# Patient Record
Sex: Female | Born: 1995 | Race: White | Hispanic: No | Marital: Single | Smoking: Never smoker
Health system: Southern US, Community
[De-identification: ages and names within clinical notes are randomized; demographics above are authoritative.]

## PROBLEM LIST (undated history)

## (undated) ENCOUNTER — Inpatient Hospital Stay (HOSPITAL_COMMUNITY): Payer: Self-pay

## (undated) ENCOUNTER — Emergency Department (HOSPITAL_COMMUNITY): Admission: EM | Payer: Medicaid Other

## (undated) ENCOUNTER — Inpatient Hospital Stay (HOSPITAL_COMMUNITY): Payer: Medicaid Other

## (undated) DIAGNOSIS — R519 Headache, unspecified: Secondary | ICD-10-CM

## (undated) DIAGNOSIS — F419 Anxiety disorder, unspecified: Secondary | ICD-10-CM

## (undated) DIAGNOSIS — N390 Urinary tract infection, site not specified: Secondary | ICD-10-CM

## (undated) DIAGNOSIS — D649 Anemia, unspecified: Secondary | ICD-10-CM

## (undated) DIAGNOSIS — G43909 Migraine, unspecified, not intractable, without status migrainosus: Secondary | ICD-10-CM

## (undated) DIAGNOSIS — R55 Syncope and collapse: Secondary | ICD-10-CM

## (undated) HISTORY — PX: NO PAST SURGERIES: SHX2092

## (undated) HISTORY — DX: Migraine, unspecified, not intractable, without status migrainosus: G43.909

## (undated) HISTORY — PX: OTHER SURGICAL HISTORY: SHX169

---
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
## 2017-02-08 IMAGING — US US ART/VEN ABD/PELV/SCROTUM DOPPLER LTD
1 series · 13 of 25 positions shown · non-contrast
Comparison: None.

CLINICAL DATA: Bilateral pelvic pain, right greater than left for
approximately 10 hours.



[Series 1: us art/ven abd/pelv/scrotum doppler ltd · 0.24mm/px · 13 of 46 slices shown]
[im 1/46]
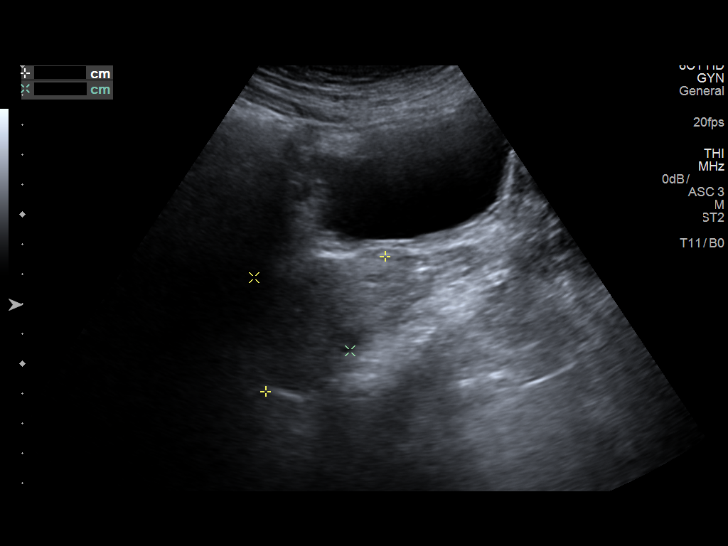
[im 4/46]
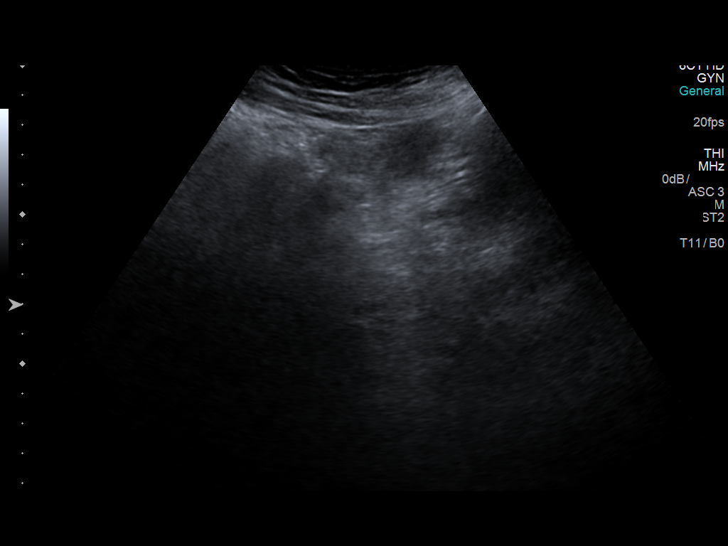
[im 8/46]
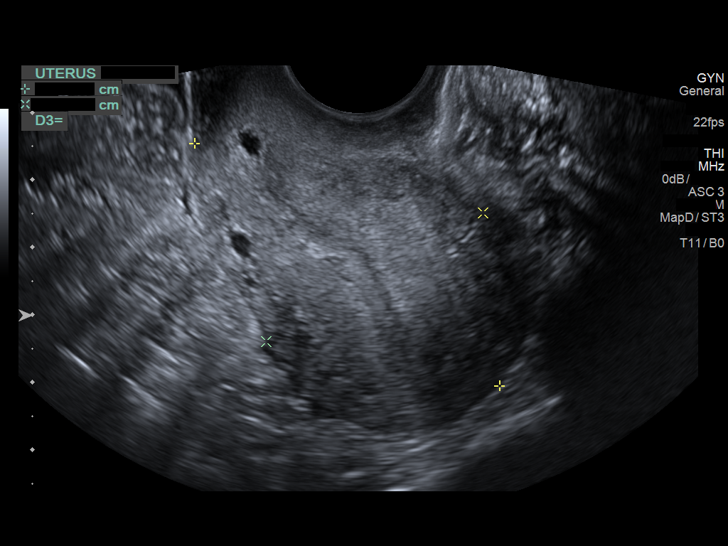
[im 12/46]
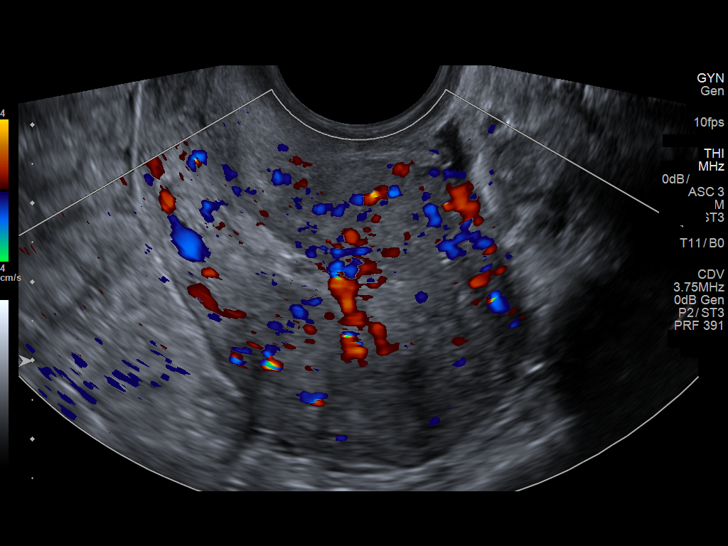
[im 16/46]
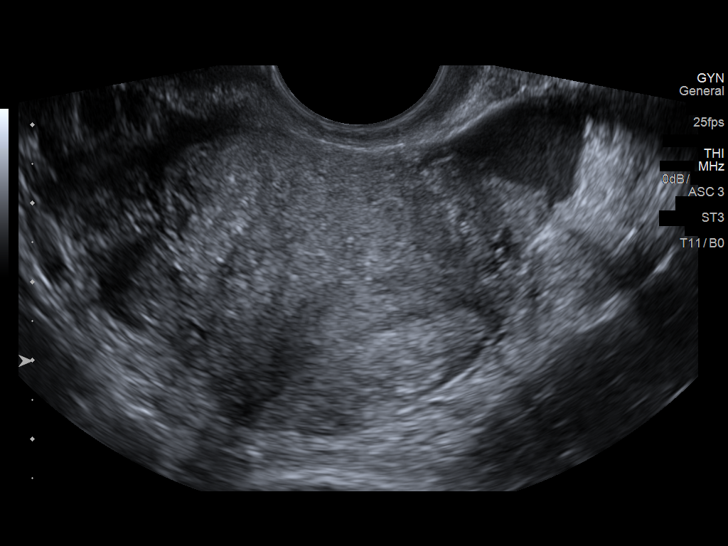
[im 19/46]
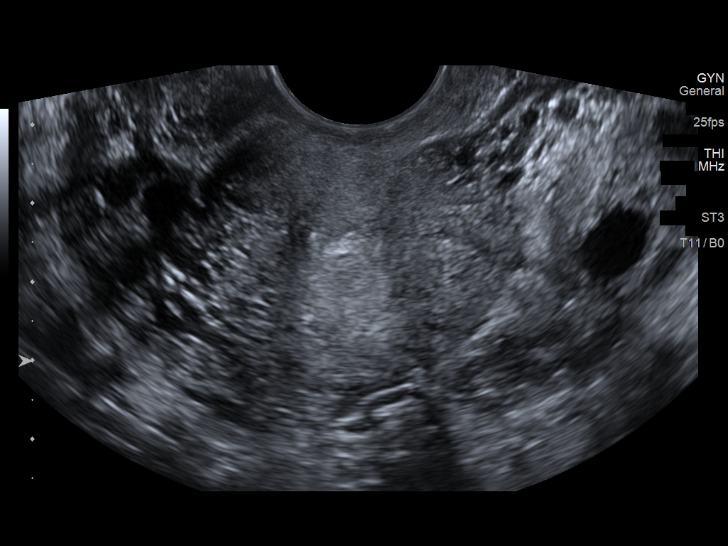
[im 23/46]
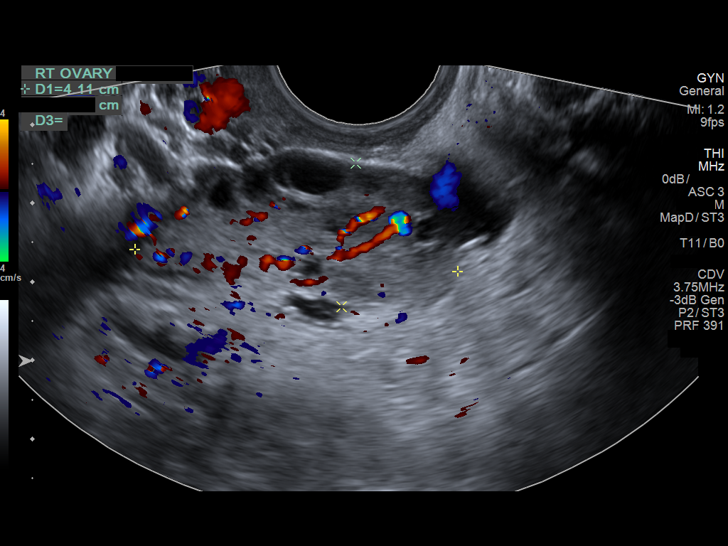
[im 27/46]
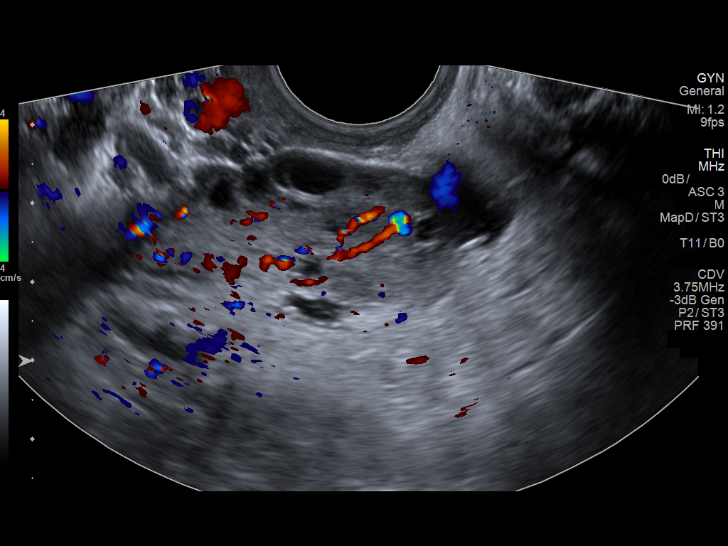
[im 31/46]
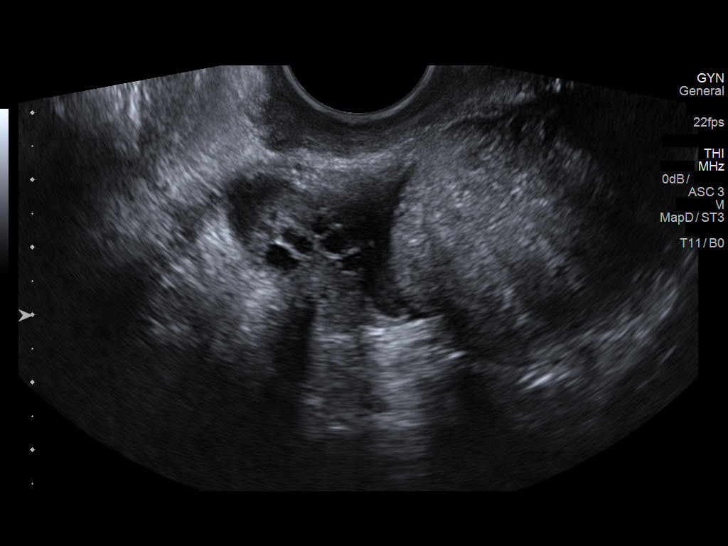
[im 34/46]
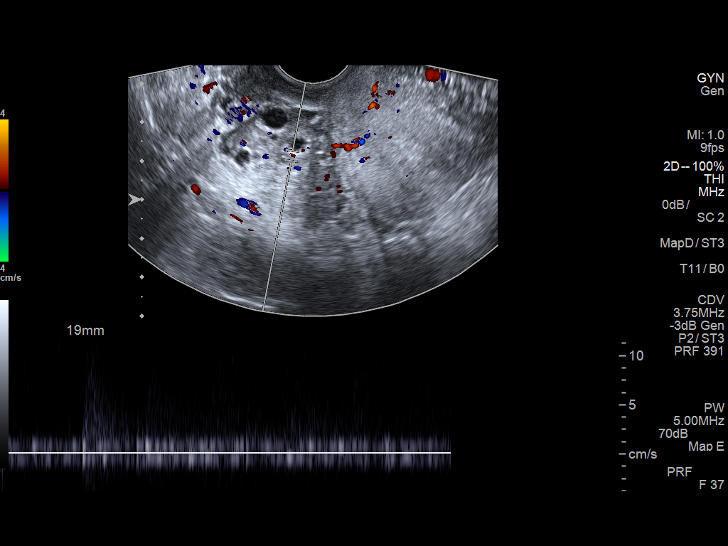
[im 38/46]
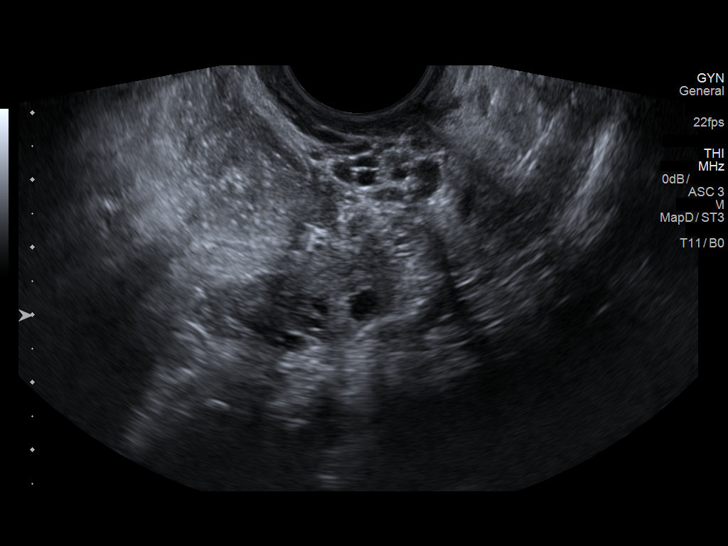
[im 42/46]
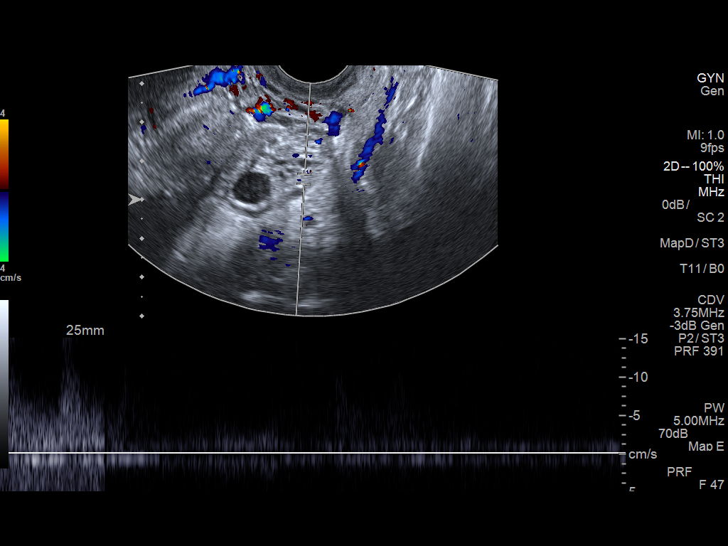
[im 46/46]
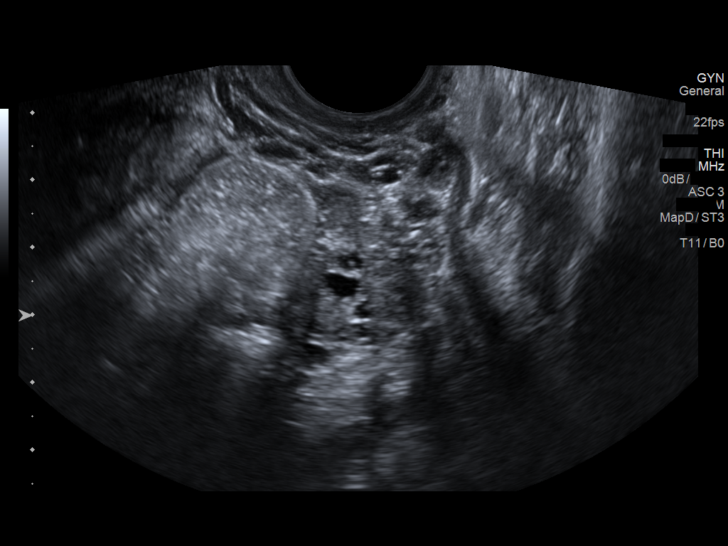

[13 of 25 positions shown; findings below may reference images not displayed]

FINDINGS: Uterus

Measurements: 5.8 x 3.7 x 4.0 cm. Retroverted position. No
myometrial abnormalities.

Endometrium

Thickness: 3.0 mm.  No focal abnormality visualized.

Right ovary

Measurements: 4.1 x 8 1.8 x 2.9 cm. Normal appearance/no adnexal
mass.

Left ovary

Measurements: 3.4 x 1.6 x 2.4 cm. Normal appearance/no adnexal mass.

Pulsed Doppler evaluation of both ovaries demonstrates normal
low-resistance arterial and venous waveforms.

Other findings

Small amount of free fluid adjacent to the right ovary.
IMPRESSION: 1. Normal sonographic appearance of both ovaries and uterus.
2. Small amount of right periadnexal fluid.
3. Patent intra-ovarian blood flow bilaterally.

## 2017-06-12 ENCOUNTER — Emergency Department
Admission: EM | Admit: 2017-06-12 | Discharge: 2017-06-12 | Disposition: A | Discharge status: Home / Self Care | Payer: 59 | Source: Home / Self Care | Attending: Family Medicine | Admitting: Family Medicine

## 2017-06-12 ENCOUNTER — Other Ambulatory Visit: Payer: Self-pay

## 2017-06-12 DIAGNOSIS — R319 Hematuria, unspecified: Secondary | ICD-10-CM | POA: Diagnosis not present

## 2017-06-12 DIAGNOSIS — N390 Urinary tract infection, site not specified: Secondary | ICD-10-CM

## 2017-06-12 HISTORY — DX: Anemia, unspecified: D64.9

## 2017-06-12 LAB — POCT CBC W AUTO DIFF (K'VILLE URGENT CARE)

## 2017-06-12 LAB — POCT URINE PREGNANCY: Preg Test, Ur: NEGATIVE

## 2017-06-12 LAB — POCT URINALYSIS DIP (MANUAL ENTRY)
Bilirubin, UA: NEGATIVE
Glucose, UA: NEGATIVE mg/dL
Ketones, POC UA: NEGATIVE mg/dL
NITRITE UA: NEGATIVE
Protein Ur, POC: 100 mg/dL — AB
UROBILINOGEN UA: 0.2 U/dL
pH, UA: 6.5 (ref 5.0–8.0)

## 2017-06-12 MED ORDER — PHENAZOPYRIDINE HCL 200 MG PO TABS: 200.0000 mg | ORAL_TABLET | Freq: Three times a day (TID) | ORAL | 0 refills | Status: DC

## 2017-06-12 MED ORDER — CIPROFLOXACIN HCL 500 MG PO TABS: 500.0000 mg | ORAL_TABLET | Freq: Two times a day (BID) | ORAL | 0 refills | Status: DC

## 2017-06-12 NOTE — ED Provider Notes (Signed)
[facility]    CSN: [id number] Arrival date & time: 06/12/17  1954     History   Chief Complaint Chief Complaint  Patient presents with  . Back Pain  . Urinary Frequency    HPI Debra Stafford is a 22 y.o. female.   Patient complains of 5 day history of dysuria, frequency, nocturia, and hematuria.  Last night she developed left back ache, and today had right back ache also.  She vomited last night.  She believes that she had fever today.  Three days ago she visited [address] in [place], where STD and urine tests are pending, but she has not been advised of results.  She denies pelvic or abdominal pain, and no vaginal discharge.  Patient's last menstrual period was 05/13/2017.  She states that she had a UTI last month. She has a family history of kidney stones (father).   The history is provided by the patient.  Dysuria  Pain quality:  Burning Pain severity:  Moderate Onset quality:  Gradual Duration:  5 days Timing:  Constant Progression:  Unchanged Chronicity:  Recurrent Recent urinary tract infections: yes   Relieved by:  Nothing Worsened by:  Nothing Ineffective treatments:  None tried Urinary symptoms: frequent urination, hematuria and hesitancy   Urinary symptoms: no discolored urine, no foul-smelling urine and no bladder incontinence   Associated symptoms: fever, flank pain, nausea and vomiting   Associated symptoms: no abdominal pain, no genital lesions and no vaginal discharge   Risk factors: sexually active     Past Medical History:  Diagnosis Date  . Anemia     There are no active problems to display for this patient.   Past Surgical History:  Procedure Laterality Date  . arm surgery      OB History    No data available       Home Medications    Prior to Admission medications   Medication Sig Start Date End Date Taking? Authorizing Provider  ciprofloxacin (CIPRO) 500 MG tablet Take 1 tablet (500 mg total) by mouth 2 (two)  times daily. 06/12/17   Lattie Haw, MD  Norgestimate-Ethinyl Estradiol Triphasic 0.18/0.215/0.25 MG-25 MCG tab Take 1 tablet by mouth daily. 03/14/16   Anders Simmonds, PA-C  phenazopyridine (PYRIDIUM) 200 MG tablet Take 1 tablet (200 mg total) by mouth 3 (three) times daily. Take after meals. 06/12/17   Lattie Haw, MD    Family History History reviewed. No pertinent family history.  Social History Social History   Tobacco Use  . Smoking status: Never Smoker  . Smokeless tobacco: Never Used  Substance Use Topics  . Alcohol use: No  . Drug use: No     Allergies   Codeine   Review of Systems Review of Systems  Constitutional: Positive for fever.  Gastrointestinal: Positive for nausea and vomiting. Negative for abdominal pain.  Genitourinary: Positive for dysuria and flank pain. Negative for vaginal discharge.  All other systems reviewed and are negative.    Physical Exam Triage Vital Signs ED Triage Vitals  Enc Vitals Group     BP 06/12/17 2024 107/73     Pulse Rate 06/12/17 2024 99     Resp --      Temp 06/12/17 2024 98 F (36.7 C)     Temp Source 06/12/17 2024 Oral     SpO2 06/12/17 2024 100 %     Weight 06/12/17 2025 128 lb (58.1 kg)     Height 06/12/17 2025  5\' 4"  (1.626 m)     Head Circumference --      Peak Flow --      Pain Score 06/12/17 2024 7     Pain Loc --      Pain Edu? --      Excl. in GC? --    No data found.  Updated Vital Signs BP 107/73 (BP Location: Left Arm)   Pulse 99   Temp 98 F (36.7 C) (Oral)   Ht 5\' 4"  (1.626 m)   Wt 128 lb (58.1 kg)   LMP 05/13/2017   SpO2 100%   BMI 21.97 kg/m   Visual Acuity Right Eye Distance:   Left Eye Distance:   Bilateral Distance:    Right Eye Near:   Left Eye Near:    Bilateral Near:     Physical Exam Nursing notes and Vital Signs reviewed. Appearance:  Patient appears stated age, and in no acute distress.    Eyes:  Pupils are equal, round, and reactive to light and  accomodation.  Extraocular movement is intact.  Conjunctivae are not inflamed   Pharynx:  Normal; moist mucous membranes  Neck:  Supple.  No adenopathy Lungs:  Clear to auscultation.  Breath sounds are equal.  Moving air well. Heart:  Regular rate and rhythm without murmurs, rubs, or gallops.  Abdomen:  Nontender without masses or hepatosplenomegaly.  Bowel sounds are present.   Bilateral mild flank tenderness present. Extremities:  No edema.  Skin:  No rash present.     UC Treatments / Results  Labs (all labs ordered are listed, but only abnormal results are displayed) Labs Reviewed  POCT URINALYSIS DIP (MANUAL ENTRY) - Abnormal; Notable for the following components:      Result Value   Clarity, UA cloudy (*)    Spec Grav, UA >=1.030 (*)    Blood, UA trace-intact (*)    Protein Ur, POC =100 (*)    Leukocytes, UA Moderate (2+) (*)    All other components within normal limits  URINE CULTURE  POCT CBC W AUTO DIFF ([facility]):  WBC 10.2; LY 26.9; MO 7.4; GR 65.7; Hgb 12.2; Platelets 220   POCT URINE PREGNANCY Negative    EKG  EKG Interpretation None       Radiology No results found.  Procedures Procedures (including critical care time)  Medications Ordered in UC Medications - No data to display   Initial Impression / Assessment and Plan / UC Course  I have reviewed the triage vital signs and the nursing notes.  Pertinent labs & imaging results that were available during my care of the patient were reviewed by me and considered in my medical decision making (see chart for details).    Urine culture pending. Begin Cipro 500mg  BID for one week.  Rx for Pyridium. Increase fluid intake. If symptoms become significantly worse during the night or over the weekend, proceed to the local emergency room.  Followup with Family Doctor if not improved in one week.     Final Clinical Impressions(s) / UC Diagnoses   Final diagnoses:  Urinary tract infection with  hematuria, site unspecified    ED Discharge Orders        Ordered    ciprofloxacin (CIPRO) 500 MG tablet  2 times daily     06/12/17 2115    phenazopyridine (PYRIDIUM) 200 MG tablet  3 times daily     06/12/17 2115          Soma Bachand,  Tera MaterStephen A, MD 06/14/17 2138

## 2017-06-12 NOTE — Discharge Instructions (Signed)
Increase fluid intake. °If symptoms become significantly worse during the night or over the weekend, proceed to the local emergency room.  °

## 2017-06-12 NOTE — ED Triage Notes (Signed)
Pt started having SX on Sunday night.  Went to [facility] Monday, had blood drawn, urine collected.  Never heard results per patient.  Has progressively become worse this week.  Yesterday, had left sided back pain, and started vomiting during the night.  Today both sides of lower back are hurting.

## 2017-06-12 NOTE — ED Notes (Signed)
Blood draw left AC, tolerated well, 1 stick.

## 2017-06-14 ENCOUNTER — Encounter (HOSPITAL_BASED_OUTPATIENT_CLINIC_OR_DEPARTMENT_OTHER): Payer: Self-pay | Admitting: Emergency Medicine

## 2017-06-14 ENCOUNTER — Emergency Department (HOSPITAL_BASED_OUTPATIENT_CLINIC_OR_DEPARTMENT_OTHER)
Admission: EM | Admit: 2017-06-14 | Discharge: 2017-06-14 | Disposition: A | Discharge status: Home / Self Care | Payer: 59 | Attending: Emergency Medicine | Admitting: Emergency Medicine

## 2017-06-14 ENCOUNTER — Other Ambulatory Visit: Payer: Self-pay

## 2017-06-14 DIAGNOSIS — M549 Dorsalgia, unspecified: Secondary | ICD-10-CM | POA: Diagnosis present

## 2017-06-14 DIAGNOSIS — R1110 Vomiting, unspecified: Secondary | ICD-10-CM | POA: Insufficient documentation

## 2017-06-14 DIAGNOSIS — Z79899 Other long term (current) drug therapy: Secondary | ICD-10-CM | POA: Diagnosis not present

## 2017-06-14 DIAGNOSIS — N12 Tubulo-interstitial nephritis, not specified as acute or chronic: Secondary | ICD-10-CM

## 2017-06-14 LAB — CBC WITH DIFFERENTIAL/PLATELET
BASOS ABS: 0 10*3/uL (ref 0.0–0.1)
Basophils Relative: 0 %
EOS PCT: 3 %
Eosinophils Absolute: 0.3 10*3/uL (ref 0.0–0.7)
HCT: 36 % (ref 36.0–46.0)
Hemoglobin: 12.2 g/dL (ref 12.0–15.0)
LYMPHS PCT: 20 %
Lymphs Abs: 1.7 10*3/uL (ref 0.7–4.0)
MCH: 30 pg (ref 26.0–34.0)
MCHC: 33.9 g/dL (ref 30.0–36.0)
MCV: 88.5 fL (ref 78.0–100.0)
Monocytes Absolute: 0.8 10*3/uL (ref 0.1–1.0)
Monocytes Relative: 9 %
Neutro Abs: 5.8 10*3/uL (ref 1.7–7.7)
Neutrophils Relative %: 68 %
PLATELETS: 226 10*3/uL (ref 150–400)
RBC: 4.07 MIL/uL (ref 3.87–5.11)
RDW: 12.1 % (ref 11.5–15.5)
WBC: 8.6 10*3/uL (ref 4.0–10.5)

## 2017-06-14 LAB — URINALYSIS, ROUTINE W REFLEX MICROSCOPIC
Glucose, UA: 100 mg/dL — AB
Hgb urine dipstick: NEGATIVE
Ketones, ur: 15 mg/dL — AB
NITRITE: POSITIVE — AB
Protein, ur: 100 mg/dL — AB
SPECIFIC GRAVITY, URINE: 1.025 (ref 1.005–1.030)
pH: 5 (ref 5.0–8.0)

## 2017-06-14 LAB — BASIC METABOLIC PANEL
Anion gap: 9 (ref 5–15)
BUN: 14 mg/dL (ref 6–20)
CO2: 24 mmol/L (ref 22–32)
CREATININE: 0.76 mg/dL (ref 0.44–1.00)
Calcium: 9.1 mg/dL (ref 8.9–10.3)
Chloride: 106 mmol/L (ref 101–111)
GFR calc Af Amer: >60 mL/min (ref >60)
Glucose, Bld: 93 mg/dL (ref 65–99)
POTASSIUM: 3.7 mmol/L (ref 3.5–5.1)
SODIUM: 139 mmol/L (ref 135–145)

## 2017-06-14 LAB — URINALYSIS, MICROSCOPIC (REFLEX): RBC / HPF: NONE SEEN RBC/hpf (ref 0–5)

## 2017-06-14 LAB — PREGNANCY, URINE: PREG TEST UR: NEGATIVE

## 2017-06-14 MED ORDER — CEPHALEXIN 500 MG PO CAPS: 500.0000 mg | ORAL_CAPSULE | Freq: Four times a day (QID) | ORAL | 0 refills | Status: DC

## 2017-06-14 MED ORDER — SODIUM CHLORIDE 0.9 % IV BOLUS (SEPSIS)
1000.0000 mL | Freq: Once | INTRAVENOUS | Status: AC
  Administered 2017-06-14: 1000 mL via INTRAVENOUS

## 2017-06-14 MED ORDER — DEXTROSE 5 % IV SOLN
1.0000 g | Freq: Once | INTRAVENOUS | Status: AC
  Administered 2017-06-14: 1 g via INTRAVENOUS
  Filled 2017-06-14: qty 10

## 2017-06-14 MED ORDER — ONDANSETRON HCL 4 MG/2ML IJ SOLN
4.0000 mg | Freq: Once | INTRAMUSCULAR | Status: AC
  Administered 2017-06-14: 4 mg via INTRAVENOUS
  Filled 2017-06-14: qty 2

## 2017-06-14 MED ORDER — MORPHINE SULFATE (PF) 4 MG/ML IV SOLN
4.0000 mg | Freq: Once | INTRAVENOUS | Status: AC
  Administered 2017-06-14: 4 mg via INTRAVENOUS
  Filled 2017-06-14: qty 1

## 2017-06-14 MED ORDER — KETOROLAC TROMETHAMINE 30 MG/ML IJ SOLN
30.0000 mg | Freq: Once | INTRAMUSCULAR | Status: AC
  Administered 2017-06-14: 30 mg via INTRAVENOUS
  Filled 2017-06-14: qty 1

## 2017-06-14 MED ORDER — ONDANSETRON HCL 4 MG PO TABS: 4.0000 mg | ORAL_TABLET | Freq: Three times a day (TID) | ORAL | 0 refills | Status: DC | PRN

## 2017-06-14 MED ORDER — AMOXICILLIN-POT CLAVULANATE 875-125 MG PO TABS: 1.0000 | ORAL_TABLET | Freq: Two times a day (BID) | ORAL | 0 refills | Status: DC

## 2017-06-14 NOTE — Discharge Instructions (Signed)
Your evaluated in the emergency department for back pain nausea and vomiting in the setting of a urinary tract infection.  You probably have a kidney infection.  You were given fluids and pain medicine and a dose of IV antibiotics.  We are switching your pill antibiotics in case you may be resistant to the one you are on.  Please continue to keep hydrated, take Tylenol or ibuprofen for pain, and finish your antibiotics that we are prescribing you.  We are also prescribing you some medicine for nausea.  You should follow-up with your doctor early this week.  Please return if you are getting any worse.

## 2017-06-14 NOTE — ED Triage Notes (Signed)
Pt presents with c/o back pain and vomiting. Pt went to urgent care Tuesday and Thursday for UTI symptoms but is worse today.

## 2017-06-14 NOTE — ED Provider Notes (Signed)
[facility] EMERGENCY DEPARTMENT Provider Note   CSN: [id number] Arrival date & time: 06/14/17  7829     History   Chief Complaint Chief Complaint  Patient presents with  . Back Pain  . Emesis    HPI Debra Stafford is a 22 y.o. female.  The history is provided by the patient.  Back Pain   This is a new problem. The current episode started more than 2 days ago. The problem occurs constantly. The problem has been gradually worsening. The pain is associated with no known injury. The pain is present in the lumbar spine. The quality of the pain is described as stabbing. The pain is severe. The symptoms are aggravated by bending, twisting and certain positions. Associated symptoms include abdominal pain and dysuria. Pertinent negatives include no chest pain, no fever, no numbness, no abdominal swelling, no bowel incontinence, no tingling and no weakness.  Emesis   Associated symptoms include abdominal pain. Pertinent negatives include no arthralgias, no chills, no cough and no fever.    22 year old female with no significant past medical history presents with urinary symptoms since Sunday.  She states she had urinary frequency dysuria and urinary hesitancy starting on Sunday.  She went to her PCP on Monday who told her that everything looked okay.  She started experiencing low back pain and continued urinary symptoms on Tuesday and Wednesday and on Thursday she went to [facility] urgent care where she was diagnosed with a UTI.  She was treated with Cipro and Pyridium.  Last night she went out for dinner and the low back pain continued to worsen.  She denies fevers but did have some sweats.  The back pain is lower may be greater on the right and is severe in nature.  It is worsened with movement.  She continues to have nausea and vomiting.  She continues with her urinary symptoms.  She denies vaginal discharge or bleeding.  The last menstrual period was about Christmas time.   Past  Medical History:  Diagnosis Date  . Anemia     There are no active problems to display for this patient.   Past Surgical History:  Procedure Laterality Date  . arm surgery      OB History    No data available       Home Medications    Prior to Admission medications   Medication Sig Start Date End Date Taking? Authorizing Provider  ciprofloxacin (CIPRO) 500 MG tablet Take 1 tablet (500 mg total) by mouth 2 (two) times daily. 06/12/17   Lattie Haw, MD  Norgestimate-Ethinyl Estradiol Triphasic 0.18/0.215/0.25 MG-25 MCG tab Take 1 tablet by mouth daily. 03/14/16   Anders Simmonds, PA-C  phenazopyridine (PYRIDIUM) 200 MG tablet Take 1 tablet (200 mg total) by mouth 3 (three) times daily. Take after meals. 06/12/17   Lattie Haw, MD    Family History No family history on file.  Social History Social History   Tobacco Use  . Smoking status: Never Smoker  . Smokeless tobacco: Never Used  Substance Use Topics  . Alcohol use: No  . Drug use: No     Allergies   Codeine   Review of Systems Review of Systems  Constitutional: Negative for chills and fever.  HENT: Negative for ear pain and sore throat.   Eyes: Negative for pain and visual disturbance.  Respiratory: Negative for cough and shortness of breath.   Cardiovascular: Negative for chest pain and palpitations.  Gastrointestinal: Positive for  abdominal pain and vomiting. Negative for bowel incontinence.  Genitourinary: Positive for dysuria. Negative for hematuria.  Musculoskeletal: Positive for back pain. Negative for arthralgias.  Skin: Negative for color change and rash.  Neurological: Negative for tingling, seizures, syncope, weakness and numbness.  All other systems reviewed and are negative.    Physical Exam Updated Vital Signs BP 122/70 (BP Location: Left Arm)   Pulse 86   Temp 97.7 F (36.5 C) (Oral)   Resp 20   Ht 5\' 4"  (1.626 m)   Wt 59 kg (130 lb)   LMP 05/13/2017   SpO2 100%   BMI  22.31 kg/m   Physical Exam  Constitutional: She appears well-developed and well-nourished. No distress.  HENT:  Head: Normocephalic and atraumatic.  Eyes: Conjunctivae are normal.  Neck: Neck supple.  Cardiovascular: Normal rate and regular rhythm.  No murmur heard. Pulmonary/Chest: Effort normal and breath sounds normal. No respiratory distress.  Abdominal: Soft. There is no tenderness. There is no tenderness at McBurney's point and negative Murphy's sign.  Musculoskeletal: Normal range of motion. She exhibits tenderness (diffuse low back). She exhibits no edema.  No CVA tenderness  Neurological: She is alert. GCS eye subscore is 4. GCS verbal subscore is 5. GCS motor subscore is 6.  Skin: Skin is warm and dry.  Psychiatric: She has a normal mood and affect.  Nursing note and vitals reviewed.    ED Treatments / Results  Labs (all labs ordered are listed, but only abnormal results are displayed) Labs Reviewed  URINALYSIS, ROUTINE W REFLEX MICROSCOPIC - Abnormal; Notable for the following components:      Result Value   Color, Urine ORANGE (*)    APPearance CLOUDY (*)    Glucose, UA 100 (*)    Bilirubin Urine MODERATE (*)    Ketones, ur 15 (*)    Protein, ur 100 (*)    Nitrite POSITIVE (*)    Leukocytes, UA TRACE (*)    All other components within normal limits  URINALYSIS, MICROSCOPIC (REFLEX) - Abnormal; Notable for the following components:   Bacteria, UA MANY (*)    Squamous Epithelial / LPF 6-30 (*)    All other components within normal limits  URINE CULTURE  PREGNANCY, URINE  BASIC METABOLIC PANEL  CBC WITH DIFFERENTIAL/PLATELET    EKG  EKG Interpretation None       Radiology No results found.  Procedures Procedures (including critical care time)  Medications Ordered in ED Medications  ondansetron (ZOFRAN) injection 4 mg (not administered)  sodium chloride 0.9 % bolus 1,000 mL (not administered)  morphine 4 MG/ML injection 4 mg (not administered)       Initial Impression / Assessment and Plan / ED Course  I have reviewed the triage vital signs and the nursing notes.  Pertinent labs & imaging results that were available during my care of the patient were reviewed by me and considered in my medical decision making (see chart for details).  Clinical Course as of Jun 14 913  Sat Jun 14, 2017  [id number] Reviewed prior workup and note from outside facility.  The urinalysis was positive for leuks and the urine culture is pending. Preg test negative. No blood work done.  She has been on Cipro for 2 days.  Also on Pyridium.  Allergic to Bactrim.  [MB]  0750 Differential diagnosis is pyelonephritis, musculoskeletal low back pain, renal colic, PID.  Patient states she saw her primary on Monday and with a clean urine they actually did STD  testing.  I do not see evidence of these labs or the note in our system.  [MB]  0831 Initial blood work looks fairly benign.  On reevaluation patient's feeling better.  The urinalysis still looks as possible infection nitrite positive.  Will give her a dose of ceftriaxone and likely need to send her out on some Keflex.  It is possible she is been resistant to fluoroquinolones why she progressed in her symptoms.  [MB]  0858 Back pain resolved after Toradol.  Patient and father comfortable with her going home on different antibiotic and to follow-up with her primary care doctor  [MB]    Clinical Course User Index [MB] Terrilee Files, MD      Final Clinical Impressions(s) / ED Diagnoses   Final diagnoses:  Pyelonephritis    ED Discharge Orders        Ordered    cephALEXin (KEFLEX) 500 MG capsule  4 times daily,   Status:  Discontinued     06/14/17 0833    ondansetron (ZOFRAN) 4 MG tablet  Every 8 hours PRN     06/14/17 0833    amoxicillin-clavulanate (AUGMENTIN) 875-125 MG tablet  Every 12 hours     06/14/17 0903       Terrilee Files, MD 06/14/17 [phone]

## 2017-06-15 ENCOUNTER — Telehealth: Payer: Self-pay | Admitting: Emergency Medicine

## 2017-06-15 LAB — URINE CULTURE
Culture: NO GROWTH
MICRO NUMBER: 90103169
SPECIMEN QUALITY: ADEQUATE

## 2017-06-15 NOTE — Telephone Encounter (Signed)
Patient was seen in the ED yesterday and treated with pylonepheritis.  ED treated patient and given another round of antibiotics.  Will follow up with PCP.

## 2018-03-13 ENCOUNTER — Inpatient Hospital Stay (HOSPITAL_COMMUNITY)
Admission: AD | Admit: 2018-03-13 | Discharge: 2018-03-13 | Disposition: A | Discharge status: Home / Self Care | Payer: Self-pay | Source: Ambulatory Visit | Attending: Obstetrics and Gynecology | Admitting: Obstetrics and Gynecology

## 2018-03-13 ENCOUNTER — Encounter (HOSPITAL_COMMUNITY): Payer: Self-pay | Admitting: *Deleted

## 2018-03-13 ENCOUNTER — Emergency Department
Admission: EM | Admit: 2018-03-13 | Discharge: 2018-03-13 | Discharge status: Absent without leave | Payer: Self-pay | Source: Home / Self Care

## 2018-03-13 ENCOUNTER — Other Ambulatory Visit: Payer: Self-pay

## 2018-03-13 DIAGNOSIS — N898 Other specified noninflammatory disorders of vagina: Secondary | ICD-10-CM

## 2018-03-13 LAB — URINALYSIS, ROUTINE W REFLEX MICROSCOPIC
Bilirubin Urine: NEGATIVE
Glucose, UA: NEGATIVE mg/dL
KETONES UR: NEGATIVE mg/dL
Nitrite: NEGATIVE
PROTEIN: NEGATIVE mg/dL
Specific Gravity, Urine: 1.019 (ref 1.005–1.030)
pH: 5 (ref 5.0–8.0)

## 2018-03-13 LAB — WET PREP, GENITAL
Clue Cells Wet Prep HPF POC: NONE SEEN
SPERM: NONE SEEN
Trich, Wet Prep: NONE SEEN
YEAST WET PREP: NONE SEEN

## 2018-03-13 LAB — POCT PREGNANCY, URINE: PREG TEST UR: NEGATIVE

## 2018-03-13 MED ORDER — TERCONAZOLE 0.4 % VA CREA: 1.0000 | TOPICAL_CREAM | Freq: Every day | VAGINAL | 0 refills | Status: AC

## 2018-03-13 NOTE — MAU Note (Addendum)
Fri morning, went to [facility] ED, had been having abd and back pain, then started bleeding profusely. - on exam they found a stuck tampon and removed.  Had STD  Test and [place]; every thing was fine.  Was given 2 different antibiotics, every time she takes them - she throws up 30 min.  Went to Primary Dr on Tue.  The antibiotics were for GC/Chlamydia, he told her to stop the antibiotics and gave her a one time Diflucan for yeast.  Had a little bleeding yesterday.  Hurts to pee, swollen and itchy now, continues to bleed.  Now the pain in her back has returned.  Started having loose stools last Friday, that also  Hurts and burns, happens once or twice a day.

## 2018-03-17 LAB — GC/CHLAMYDIA PROBE AMP (~~LOC~~) NOT AT ARMC
Chlamydia: NEGATIVE
Neisseria Gonorrhea: NEGATIVE

## 2018-04-17 ENCOUNTER — Other Ambulatory Visit: Payer: Self-pay

## 2018-04-17 ENCOUNTER — Encounter: Payer: Self-pay | Admitting: *Deleted

## 2018-04-17 ENCOUNTER — Emergency Department
Admission: EM | Admit: 2018-04-17 | Discharge: 2018-04-17 | Disposition: A | Discharge status: Home / Self Care | Payer: Self-pay | Source: Home / Self Care | Attending: Family Medicine | Admitting: Family Medicine

## 2018-04-17 DIAGNOSIS — J069 Acute upper respiratory infection, unspecified: Secondary | ICD-10-CM

## 2018-04-17 DIAGNOSIS — J029 Acute pharyngitis, unspecified: Secondary | ICD-10-CM

## 2018-04-17 DIAGNOSIS — B9789 Other viral agents as the cause of diseases classified elsewhere: Secondary | ICD-10-CM

## 2018-04-17 LAB — POCT INFLUENZA A/B
Influenza A, POC: NEGATIVE
Influenza B, POC: NEGATIVE

## 2018-04-17 LAB — POCT RAPID STREP A (OFFICE): Rapid Strep A Screen: NEGATIVE

## 2018-04-17 MED ORDER — BENZONATATE 100 MG PO CAPS: 100.0000 mg | ORAL_CAPSULE | Freq: Three times a day (TID) | ORAL | 0 refills | Status: DC

## 2018-04-17 NOTE — ED Triage Notes (Signed)
Pt c/o cough, sore throat, fever and chills x 2-3 days.

## 2018-04-17 NOTE — Discharge Instructions (Signed)
°  You may take 500mg acetaminophen every 4-6 hours or in combination with ibuprofen 400-600mg every 6-8 hours as needed for pain, inflammation, and fever. ° °Be sure to stay well hydrated and get at least 8 hours of sleep at night, preferably more while sick.  °Please follow up with family medicine in 1 week if not improving.  ° °

## 2018-04-17 NOTE — ED Provider Notes (Addendum)
[facility]    CSN: [id number] Arrival date & time: 04/17/18  1557     History   Chief Complaint Chief Complaint  Patient presents with  . Cough  . Sore Throat    HPI Debra Stafford is a 22 y.o. female.   HPI Debra Stafford is a 22 y.o. female presenting to UC with c/o sore throat, mildly productive cough, HA, body aches, fever up to 102.7*F and chills the last 2-3 days. She did not receive the flu vaccine. No known sick contacts but states she thinks she got her boyfriend sick. Denies n/v/d.    Past Medical History:  Diagnosis Date  . Anemia     Patient Active Problem List   Diagnosis Date Noted  . Vaginal irritation 03/13/2018    History reviewed. No pertinent surgical history.  OB History    Gravida  0   Para  0   Term  0   Preterm  0   AB  0   Living  0     SAB  0   TAB  0   Ectopic  0   Multiple  0   Live Births  0            Home Medications    Prior to Admission medications   Medication Sig Start Date End Date Taking? Authorizing Provider  benzonatate (TESSALON) 100 MG capsule Take 1-2 capsules (100-200 mg total) by mouth every 8 (eight) hours. 04/17/18   Lurene Shadow, PA-C    Family History History reviewed. No pertinent family history.  Social History Social History   Tobacco Use  . Smoking status: Never Smoker  . Smokeless tobacco: Never Used  Substance Use Topics  . Alcohol use: No  . Drug use: No     Allergies   Codeine and Sulfamethoxazole-trimethoprim   Review of Systems Review of Systems  Constitutional: Positive for chills, fatigue and fever.  HENT: Positive for congestion and sore throat. Negative for ear pain, trouble swallowing and voice change.   Respiratory: Positive for cough. Negative for shortness of breath.   Cardiovascular: Negative for chest pain and palpitations.  Gastrointestinal: Negative for abdominal pain, diarrhea, nausea and vomiting.  Musculoskeletal: Positive for arthralgias,  back pain and myalgias.  Skin: Negative for rash.  Neurological: Positive for headaches.     Physical Exam Triage Vital Signs ED Triage Vitals [04/17/18 1627]  Enc Vitals Group     BP 108/73     Pulse Rate (!) 115     Resp 18     Temp (!) 100.4 F (38 C)     Temp Source Oral     SpO2 98 %     Weight      Height 5\' 5"  (1.651 m)     Head Circumference      Peak Flow      Pain Score 0     Pain Loc      Pain Edu?      Excl. in GC?    No data found.  Updated Vital Signs BP 108/73 (BP Location: Right Arm)   Pulse (!) 115   Temp (!) 100.4 F (38 C) (Oral)   Resp 18   Ht 5\' 5"  (1.651 m)   LMP 03/27/2018   SpO2 98%   BMI 20.30 kg/m   Visual Acuity Right Eye Distance:   Left Eye Distance:   Bilateral Distance:    Right Eye Near:   Left Eye Near:  Bilateral Near:     Physical Exam  Constitutional: She is oriented to person, place, and time. She appears well-developed and well-nourished.  Non-toxic appearance. She does not appear ill. No distress.  HENT:  Head: Normocephalic and atraumatic.  Right Ear: Tympanic membrane normal.  Left Ear: Tympanic membrane normal.  Nose: Nose normal. Right sinus exhibits no maxillary sinus tenderness and no frontal sinus tenderness. Left sinus exhibits no maxillary sinus tenderness and no frontal sinus tenderness.  Mouth/Throat: Uvula is midline, oropharynx is clear and moist and mucous membranes are normal.  Eyes: EOM are normal.  Neck: Normal range of motion. Neck supple.  Cardiovascular: Regular rhythm. Tachycardia present.  Pulmonary/Chest: Effort normal and breath sounds normal. No stridor. No respiratory distress. She has no wheezes. She has no rhonchi.  Musculoskeletal: Normal range of motion.  Neurological: She is alert and oriented to person, place, and time.  Skin: Skin is warm and dry.  Psychiatric: She has a normal mood and affect. Her behavior is normal.  Nursing note and vitals reviewed.    UC Treatments /  Results  Labs (all labs ordered are listed, but only abnormal results are displayed) Labs Reviewed  STREP A DNA PROBE  POCT RAPID STREP A (OFFICE)  POCT INFLUENZA A/B    EKG None  Radiology No results found.  Procedures Procedures (including critical care time)  Medications Ordered in UC Medications - No data to display  Initial Impression / Assessment and Plan / UC Course  I have reviewed the triage vital signs and the nursing notes.  Pertinent labs & imaging results that were available during my care of the patient were reviewed by me and considered in my medical decision making (see chart for details).     Rapid strep and flu: NEGATIVE Hx and exam c/w viral illness Encouraged symptomatic treatment.  Final Clinical Impressions(s) / UC Diagnoses   Final diagnoses:  Acute pharyngitis, unspecified etiology  Viral URI with cough     Discharge Instructions      You may take 500mg  acetaminophen every 4-6 hours or in combination with ibuprofen 400-600mg  every 6-8 hours as needed for pain, inflammation, and fever.  Be sure to stay well hydrated and get at least 8 hours of sleep at night, preferably more while sick.   Please follow up with family medicine in 1 week if not improving.     ED Prescriptions    Medication Sig Dispense Auth. Provider   benzonatate (TESSALON) 100 MG capsule Take 1-2 capsules (100-200 mg total) by mouth every 8 (eight) hours. 21 capsule Lurene ShadowPhelps, Faelyn Sigler O, PA-C     Controlled Substance Prescriptions [place] Controlled Substance Registry consulted? Not Applicable   Rolla Platehelps, Elektra Wartman O, PA-C 04/17/18 1717    Lurene ShadowPhelps, Darby Shadwick O, PA-C 04/17/18 1718

## 2018-04-18 ENCOUNTER — Telehealth: Payer: Self-pay | Admitting: Emergency Medicine

## 2018-04-18 LAB — STREP A DNA PROBE: Group A Strep Probe: NOT DETECTED

## 2018-04-18 NOTE — Telephone Encounter (Signed)
Patient informed of results and will follow up as needed

## 2018-04-21 ENCOUNTER — Emergency Department (HOSPITAL_BASED_OUTPATIENT_CLINIC_OR_DEPARTMENT_OTHER)
Admission: EM | Admit: 2018-04-21 | Discharge: 2018-04-21 | Disposition: A | Discharge status: Home / Self Care | Payer: Self-pay | Attending: Emergency Medicine | Admitting: Emergency Medicine

## 2018-04-21 ENCOUNTER — Other Ambulatory Visit: Payer: Self-pay

## 2018-04-21 ENCOUNTER — Encounter (HOSPITAL_BASED_OUTPATIENT_CLINIC_OR_DEPARTMENT_OTHER): Payer: Self-pay

## 2018-04-21 ENCOUNTER — Emergency Department (HOSPITAL_BASED_OUTPATIENT_CLINIC_OR_DEPARTMENT_OTHER): Payer: Self-pay

## 2018-04-21 DIAGNOSIS — J189 Pneumonia, unspecified organism: Secondary | ICD-10-CM | POA: Insufficient documentation

## 2018-04-21 DIAGNOSIS — J181 Lobar pneumonia, unspecified organism: Secondary | ICD-10-CM

## 2018-04-21 DIAGNOSIS — B309 Viral conjunctivitis, unspecified: Secondary | ICD-10-CM | POA: Insufficient documentation

## 2018-04-21 LAB — CBC WITH DIFFERENTIAL/PLATELET
Abs Immature Granulocytes: 0.03 10*3/uL (ref 0.00–0.07)
Basophils Absolute: 0 10*3/uL (ref 0.0–0.1)
Basophils Relative: 0 %
Eosinophils Absolute: 0.2 10*3/uL (ref 0.0–0.5)
Eosinophils Relative: 1 %
HEMATOCRIT: 36.7 % (ref 36.0–46.0)
HEMOGLOBIN: 11.8 g/dL — AB (ref 12.0–15.0)
Immature Granulocytes: 0 %
LYMPHS ABS: 1.4 10*3/uL (ref 0.7–4.0)
LYMPHS PCT: 12 %
MCH: 29.1 pg (ref 26.0–34.0)
MCHC: 32.2 g/dL (ref 30.0–36.0)
MCV: 90.6 fL (ref 80.0–100.0)
MONO ABS: 1.1 10*3/uL — AB (ref 0.1–1.0)
Monocytes Relative: 9 %
Neutro Abs: 9.1 10*3/uL — ABNORMAL HIGH (ref 1.7–7.7)
Neutrophils Relative %: 78 %
Platelets: 212 10*3/uL (ref 150–400)
RBC: 4.05 MIL/uL (ref 3.87–5.11)
RDW: 12.7 % (ref 11.5–15.5)
WBC: 11.9 10*3/uL — AB (ref 4.0–10.5)
nRBC: 0 % (ref 0.0–0.2)

## 2018-04-21 LAB — BASIC METABOLIC PANEL
Anion gap: 9 (ref 5–15)
BUN: 11 mg/dL (ref 6–20)
CO2: 23 mmol/L (ref 22–32)
Calcium: 9.1 mg/dL (ref 8.9–10.3)
Chloride: 104 mmol/L (ref 98–111)
Creatinine, Ser: 0.63 mg/dL (ref 0.44–1.00)
GFR calc Af Amer: >60 mL/min (ref >60)
GFR calc non Af Amer: >60 mL/min (ref >60)
GLUCOSE: 84 mg/dL (ref 70–99)
Potassium: 3.7 mmol/L (ref 3.5–5.1)
Sodium: 136 mmol/L (ref 135–145)

## 2018-04-21 MED ORDER — ERYTHROMYCIN 5 MG/GM OP OINT: TOPICAL_OINTMENT | OPHTHALMIC | 0 refills | Status: DC

## 2018-04-21 MED ORDER — SODIUM CHLORIDE 0.9 % IV SOLN
1.0000 g | Freq: Once | INTRAVENOUS | Status: AC
  Administered 2018-04-21: 1 g via INTRAVENOUS
  Filled 2018-04-21: qty 10

## 2018-04-21 MED ORDER — SODIUM CHLORIDE 0.9 % IV BOLUS
1000.0000 mL | Freq: Once | INTRAVENOUS | Status: AC
  Administered 2018-04-21: 1000 mL via INTRAVENOUS

## 2018-04-21 MED ORDER — AZITHROMYCIN 250 MG PO TABS: 250.0000 mg | ORAL_TABLET | Freq: Every day | ORAL | 0 refills | Status: DC

## 2018-04-21 NOTE — ED Triage Notes (Signed)
Pt presents with cough, congestion, eye drainage and fevers x 1 week. Pt seen at urgent care for same with neg strep/flu.

## 2018-04-21 NOTE — ED Notes (Signed)
Patient left at this time with all belongings. 

## 2018-04-21 NOTE — ED Notes (Signed)
ED Provider at bedside. 

## 2018-04-21 NOTE — Discharge Instructions (Addendum)
Take the antibiotic as directed.  Can start her first dose today.  Use the erythromycin ophthalmic ointment to put into your eyes for the conjunctivitis.  This will help prevent secondary infection.  Would expect improvement as far as the pneumonia goes over the next 2 days.  Return for any new or worse symptoms.

## 2018-04-21 NOTE — ED Provider Notes (Signed)
[facility] EMERGENCY DEPARTMENT Provider Note   CSN: [id number] Arrival date & time: 04/21/18  0854     History   Chief Complaint Chief Complaint  Patient presents with  . Fever    HPI Debra Stafford is a 22 y.o. female.   Patient presenting with a one-week history of cough congestion fever body aches.  Patient had been seen at urgent care for same earlier with negative strep and flu testing.  Patient also with some eye drainage and crusting to her eyes each morning.  Been having fevers on and off for a week.  No nausea vomiting or diarrhea no rash.  Patient was not tested for mono.     Past Medical History:  Diagnosis Date  . Anemia     Patient Active Problem List   Diagnosis Date Noted  . Vaginal irritation 03/13/2018    History reviewed. No pertinent surgical history.   OB History    Gravida  0   Para  0   Term  0   Preterm  0   AB  0   Living  0     SAB  0   TAB  0   Ectopic  0   Multiple  0   Live Births  0            Home Medications    Prior to Admission medications   Medication Sig Start Date End Date Taking? Authorizing Provider  azithromycin (ZITHROMAX) 250 MG tablet Take 1 tablet (250 mg total) by mouth daily. Take first 2 tablets together, then 1 every day until finished. 04/21/18   Vanetta Mulders, MD  benzonatate (TESSALON) 100 MG capsule Take 1-2 capsules (100-200 mg total) by mouth every 8 (eight) hours. 04/17/18   Lurene Shadow, PA-C  erythromycin ophthalmic ointment Place a 1/2 inch ribbon of ointment into the lower eyelid of both eyes TID for 7 days. 04/21/18   Vanetta Mulders, MD    Family History No family history on file.  Social History Social History   Tobacco Use  . Smoking status: Never Smoker  . Smokeless tobacco: Never Used  Substance Use Topics  . Alcohol use: Yes  . Drug use: No     Allergies   Codeine and Sulfamethoxazole-trimethoprim   Review of Systems Review of Systems    Constitutional: Positive for fatigue and fever.  HENT: Positive for congestion and sore throat.   Eyes: Positive for discharge.  Respiratory: Negative for shortness of breath.   Cardiovascular: Negative for chest pain.  Gastrointestinal: Negative for abdominal pain.  Genitourinary: Negative for dysuria.  Musculoskeletal: Positive for myalgias. Negative for back pain and neck stiffness.  Skin: Negative for rash.  Neurological: Negative for headaches.  Hematological: Does not bruise/bleed easily.  Psychiatric/Behavioral: Negative for confusion.     Physical Exam Updated Vital Signs BP 94/78 (BP Location: Left Arm)   Pulse (!) 116   Temp 98.8 F (37.1 C) (Oral)   Resp 18   Ht 1.651 m (5\' 5" )   Wt 55.3 kg   LMP 03/27/2018   SpO2 100%   BMI 20.30 kg/m   Physical Exam Vitals signs and nursing note reviewed.  Constitutional:      General: She is not in acute distress.    Appearance: Normal appearance. She is not toxic-appearing.  HENT:     Head: Normocephalic and atraumatic.     Mouth/Throat:     Mouth: Mucous membranes are dry.     Pharynx:  Posterior oropharyngeal erythema present.  Eyes:     General:        Right eye: Discharge present.        Left eye: Discharge present.    Extraocular Movements: Extraocular movements intact.     Pupils: Pupils are equal, round, and reactive to light.  Neck:     Musculoskeletal: Normal range of motion and neck supple. No neck rigidity.  Cardiovascular:     Rate and Rhythm: Tachycardia present.     Pulses: Normal pulses.     Heart sounds: Normal heart sounds.  Pulmonary:     Effort: Pulmonary effort is normal.     Breath sounds: Normal breath sounds. No wheezing.  Abdominal:     General: Bowel sounds are normal.     Palpations: Abdomen is soft.     Tenderness: There is no abdominal tenderness.  Musculoskeletal: Normal range of motion.        General: No swelling.  Skin:    Capillary Refill: Capillary refill takes less than 2  seconds.     Findings: No rash.  Neurological:     General: No focal deficit present.     Mental Status: She is alert and oriented to person, place, and time.      ED Treatments / Results  Labs (all labs ordered are listed, but only abnormal results are displayed) Labs Reviewed  CBC WITH DIFFERENTIAL/PLATELET - Abnormal; Notable for the following components:      Result Value   WBC 11.9 (*)    Hemoglobin 11.8 (*)    Neutro Abs 9.1 (*)    Monocytes Absolute 1.1 (*)    All other components within normal limits  BASIC METABOLIC PANEL    EKG None  Radiology Dg Chest 2 View  Result Date: 04/21/2018 CLINICAL DATA:  Cough, congestion, and fever for the past week. EXAM: CHEST - 2 VIEW COMPARISON:  Chest x-ray dated March 17, 2015. FINDINGS: The heart size and mediastinal contours are within normal limits. Normal pulmonary vascularity. Small focal opacity in the right upper lobe. No pleural effusion or pneumothorax. No acute osseous abnormality. IMPRESSION: 1. Small focal opacity in the right upper lobe, suspicious for pneumonia given clinical history. Electronically Signed   By: Obie Dredge M.D.   On: 04/21/2018 10:03    Procedures Procedures (including critical care time)  Medications Ordered in ED Medications  sodium chloride 0.9 % bolus 1,000 mL (0 mLs Intravenous Stopped 04/21/18 1129)  cefTRIAXone (ROCEPHIN) 1 g in sodium chloride 0.9 % 100 mL IVPB (0 g Intravenous Stopped 04/21/18 1129)  sodium chloride 0.9 % bolus 1,000 mL (0 mLs Intravenous Stopped 04/21/18 1338)     Initial Impression / Assessment and Plan / ED Course  I have reviewed the triage vital signs and the nursing notes.  Pertinent labs & imaging results that were available during my care of the patient were reviewed by me and considered in my medical decision making (see chart for details).    Was thinking patient may have clinically had mononucleosis.  The chest x-ray shows evidence of a pneumonia.   Will treat patient for community-acquired pneumonia.  Patient has bilateral eye conjunctivitis which is most likely viral.  Does not seem to be any significant infection.  Patient be given prescription for erythromycin ophthalmic ointment.  Patient will be treated with Zithromax as an outpatient.  Patient received 1 g of Rocephin IV here received a liter of normal saline x2.  Patient overall with some improvement  while here.  Patient stable for discharge home.   Final Clinical Impressions(s) / ED Diagnoses   Final diagnoses:  Community acquired pneumonia of right upper lobe of lung (HCC)  Acute viral conjunctivitis of both eyes    ED Discharge Orders         Ordered    azithromycin (ZITHROMAX) 250 MG tablet  Daily     04/21/18 1441    erythromycin ophthalmic ointment     04/21/18 1441           Vanetta MuldersZackowski, Jabin Tapp, MD 05/02/18 [phone]

## 2018-04-21 NOTE — ED Notes (Signed)
Patient transported to X-ray 

## 2018-05-18 ENCOUNTER — Emergency Department (HOSPITAL_BASED_OUTPATIENT_CLINIC_OR_DEPARTMENT_OTHER): Payer: Self-pay

## 2018-05-18 ENCOUNTER — Emergency Department (HOSPITAL_BASED_OUTPATIENT_CLINIC_OR_DEPARTMENT_OTHER)
Admission: EM | Admit: 2018-05-18 | Discharge: 2018-05-18 | Disposition: A | Discharge status: Home / Self Care | Payer: Self-pay | Attending: Emergency Medicine | Admitting: Emergency Medicine

## 2018-05-18 ENCOUNTER — Encounter (HOSPITAL_BASED_OUTPATIENT_CLINIC_OR_DEPARTMENT_OTHER): Payer: Self-pay | Admitting: Emergency Medicine

## 2018-05-18 ENCOUNTER — Other Ambulatory Visit: Payer: Self-pay

## 2018-05-18 DIAGNOSIS — W0110XA Fall on same level from slipping, tripping and stumbling with subsequent striking against unspecified object, initial encounter: Secondary | ICD-10-CM | POA: Insufficient documentation

## 2018-05-18 DIAGNOSIS — Y9389 Activity, other specified: Secondary | ICD-10-CM | POA: Insufficient documentation

## 2018-05-18 DIAGNOSIS — Y929 Unspecified place or not applicable: Secondary | ICD-10-CM | POA: Insufficient documentation

## 2018-05-18 DIAGNOSIS — Y990 Civilian activity done for income or pay: Secondary | ICD-10-CM | POA: Insufficient documentation

## 2018-05-18 DIAGNOSIS — S0990XA Unspecified injury of head, initial encounter: Secondary | ICD-10-CM | POA: Insufficient documentation

## 2018-05-18 DIAGNOSIS — R55 Syncope and collapse: Secondary | ICD-10-CM | POA: Insufficient documentation

## 2018-05-18 LAB — CBC WITH DIFFERENTIAL/PLATELET
ABS IMMATURE GRANULOCYTES: 0.05 10*3/uL (ref 0.00–0.07)
BASOS ABS: 0.1 10*3/uL (ref 0.0–0.1)
Basophils Relative: 1 %
EOS PCT: 3 %
Eosinophils Absolute: 0.3 10*3/uL (ref 0.0–0.5)
HCT: 39.1 % (ref 36.0–46.0)
HEMOGLOBIN: 12.4 g/dL (ref 12.0–15.0)
Immature Granulocytes: 0 %
LYMPHS ABS: 1.7 10*3/uL (ref 0.7–4.0)
LYMPHS PCT: 14 %
MCH: 28.8 pg (ref 26.0–34.0)
MCHC: 31.7 g/dL (ref 30.0–36.0)
MCV: 90.7 fL (ref 80.0–100.0)
Monocytes Absolute: 0.7 10*3/uL (ref 0.1–1.0)
Monocytes Relative: 5 %
NEUTROS ABS: 9.7 10*3/uL — AB (ref 1.7–7.7)
NRBC: 0 % (ref 0.0–0.2)
Neutrophils Relative %: 77 %
Platelets: 242 10*3/uL (ref 150–400)
RBC: 4.31 MIL/uL (ref 3.87–5.11)
RDW: 12.9 % (ref 11.5–15.5)
WBC: 12.5 10*3/uL — ABNORMAL HIGH (ref 4.0–10.5)

## 2018-05-18 LAB — COMPREHENSIVE METABOLIC PANEL
ALT: 11 U/L (ref 0–44)
AST: 19 U/L (ref 15–41)
Albumin: 3.7 g/dL (ref 3.5–5.0)
Alkaline Phosphatase: 65 U/L (ref 38–126)
Anion gap: 6 (ref 5–15)
BUN: 15 mg/dL (ref 6–20)
CHLORIDE: 106 mmol/L (ref 98–111)
CO2: 24 mmol/L (ref 22–32)
CREATININE: 0.64 mg/dL (ref 0.44–1.00)
Calcium: 8.9 mg/dL (ref 8.9–10.3)
GFR calc Af Amer: >60 mL/min (ref >60)
GFR calc non Af Amer: >60 mL/min (ref >60)
Glucose, Bld: 83 mg/dL (ref 70–99)
Potassium: 4.2 mmol/L (ref 3.5–5.1)
SODIUM: 136 mmol/L (ref 135–145)
Total Bilirubin: 0.7 mg/dL (ref 0.3–1.2)
Total Protein: 7.1 g/dL (ref 6.5–8.1)

## 2018-05-18 LAB — URINALYSIS, MICROSCOPIC (REFLEX)

## 2018-05-18 LAB — URINALYSIS, ROUTINE W REFLEX MICROSCOPIC
Bilirubin Urine: NEGATIVE
Glucose, UA: NEGATIVE mg/dL
Hgb urine dipstick: NEGATIVE
Ketones, ur: NEGATIVE mg/dL
NITRITE: NEGATIVE
PROTEIN: NEGATIVE mg/dL
Specific Gravity, Urine: 1.025 (ref 1.005–1.030)
pH: 6 (ref 5.0–8.0)

## 2018-05-18 LAB — PREGNANCY, URINE: PREG TEST UR: NEGATIVE

## 2018-05-18 LAB — TROPONIN I

## 2018-05-18 NOTE — ED Notes (Signed)
NAD at this time. Pt Is stable and going home.  

## 2018-05-18 NOTE — ED Triage Notes (Signed)
Per EMS patient at work this morning.  States she had syncopal episode and fell, hitting the back of her head.  States that she had orthostatic changes.

## 2018-05-18 NOTE — ED Provider Notes (Signed)
Emergency Department Provider Note   I have reviewed the triage vital signs and the nursing notes.   HISTORY  Chief Complaint Loss of Consciousness   HPI Debra Stafford is a 22 y.o. female with PMH of anemia resents to the emergency department for evaluation after syncope event at work.  Patient states it was her first day at a new job and she was standing when she suddenly felt crampy abdominal pain and sweating.  She recalls asking to go the bathroom but then woke up with people standing around her saying that she passed out.  The episode was brief.  She denies any vomiting or diarrhea.  She does believe she struck her head on something while falling she has some swelling and pain to the right, lateral scalp.  Patient with additional pain to the right elbow. No fever or chills. Denies any chest pain, SOB, or palpitations.    Past Medical History:  Diagnosis Date  . Anemia     Patient Active Problem List   Diagnosis Date Noted  . Vaginal irritation 03/13/2018    History reviewed. No pertinent surgical history.  Allergies Codeine and Sulfamethoxazole-trimethoprim  History reviewed. No pertinent family history.  Social History Social History   Tobacco Use  . Smoking status: Never Smoker  . Smokeless tobacco: Never Used  Substance Use Topics  . Alcohol use: Yes  . Drug use: No    Review of Systems  Constitutional: No fever/chills Eyes: No visual changes. ENT: No sore throat. Cardiovascular: Denies chest pain. Positive syncope.  Respiratory: Denies shortness of breath. Gastrointestinal: Positive cramping abdominal pain.  No nausea, no vomiting.  No diarrhea.  No constipation. Genitourinary: Negative for dysuria. Musculoskeletal: Negative for back pain. Skin: Negative for rash. Neurological: Negative for focal weakness or numbness. Positive HA.   10-point ROS otherwise negative.  ____________________________________________   PHYSICAL EXAM:  VITAL  SIGNS: ED Triage Vitals  Enc Vitals Group     BP 05/18/18 1256 96/66     Pulse Rate 05/18/18 1256 83     Resp 05/18/18 1256 16     Temp 05/18/18 1256 97.9 F (36.6 C)     Temp Source 05/18/18 1256 Oral     SpO2 05/18/18 1256 100 %     Weight 05/18/18 1256 120 lb (54.4 kg)     Height 05/18/18 1256 5\' 5"  (1.651 m)     Pain Score 05/18/18 1256 8   Constitutional: Alert and oriented. Well appearing and in no acute distress. Eyes: Conjunctivae are normal. Head: 2 cm right parietal hematoma without laceration or abrasion.  Nose: No congestion/rhinnorhea. Mouth/Throat: Mucous membranes are moist.  Neck: No stridor. Cardiovascular: Normal rate, regular rhythm. Good peripheral circulation. Grossly normal heart sounds.   Respiratory: Normal respiratory effort.  No retractions. Lungs CTAB. Gastrointestinal: Soft and nontender. No distention.  Musculoskeletal: No lower extremity tenderness nor edema. No gross deformities of extremities. Neurologic:  Normal speech and language. No gross focal neurologic deficits are appreciated.  Skin:  Skin is warm, dry and intact. No rash noted.   ____________________________________________   LABS (all labs ordered are listed, but only abnormal results are displayed)  Labs Reviewed  URINALYSIS, ROUTINE W REFLEX MICROSCOPIC - Abnormal; Notable for the following components:      Result Value   APPearance CLOUDY (*)    Leukocytes, UA TRACE (*)    All other components within normal limits  CBC WITH DIFFERENTIAL/PLATELET - Abnormal; Notable for the following components:   WBC 12.5 (*)  Neutro Abs 9.7 (*)    All other components within normal limits  URINALYSIS, MICROSCOPIC (REFLEX) - Abnormal; Notable for the following components:   Bacteria, UA MANY (*)    All other components within normal limits  PREGNANCY, URINE  COMPREHENSIVE METABOLIC PANEL  TROPONIN I   ____________________________________________  EKG   EKG  Interpretation  Date/Time:  Monday May 18 2018 14:11:01 EST Ventricular Rate:  73 PR Interval:  122 QRS Duration: 80 QT Interval:  406 QTC Calculation: 447 R Axis:   90 Text Interpretation:  Sinus rhythm with marked sinus arrhythmia Rightward axis Borderline ECG No STEMI.  Confirmed by Alona BeneLong, Korbin Mapps [id number]) on 05/18/2018 2:31:44 PM       ____________________________________________  RADIOLOGY  Ct Head Wo Contrast  Result Date: 05/18/2018 CLINICAL DATA:  Syncope with head trauma EXAM: CT HEAD WITHOUT CONTRAST TECHNIQUE: Contiguous axial images were obtained from the base of the skull through the vertex without intravenous contrast. COMPARISON:  None. FINDINGS: Brain: No evidence of swelling, hemorrhage, hydrocephalus, extra-axial collection or mass lesion/mass effect. Vascular: No hyperdense vessel or unexpected calcification. Skull: Right parietal scalp swelling.  No calvarial fracture. Sinuses/Orbits: No evidence of injury IMPRESSION: 1. No evidence of intracranial injury. 2. Right parietal scalp contusion without fracture. Electronically Signed   By: Marnee SpringJonathon  Watts M.D.   On: 05/18/2018 13:57    ____________________________________________   PROCEDURES  Procedure(s) performed:   Procedures  None ____________________________________________   INITIAL IMPRESSION / ASSESSMENT AND PLAN / ED COURSE  Pertinent labs & imaging results that were available during my care of the patient were reviewed by me and considered in my medical decision making (see chart for details).  Patient presents to the emergency department for evaluation of syncope.  She did not eat breakfast this morning and went to her job at which point she had a syncope event which sounds very typical for vasovagal syncope.  She did not have any chest pain, shortness of breath, heart palpitations prior to passing out.  She does have a hematoma to the right parietal scalp.  CT head shows no acute fracture or  bleeding.  Patient is neuro intact.  Obtaining screening labs and giving IV fluids started by EMS.   CT and labs with no acute findings. Unremarkable EKG. Plan for Tylenol/Motrin at home. Mild concussion is possible and discussed symptoms with the patient and father now at bedside.   At this time, I do not feel there is any life-threatening condition present. I have reviewed and discussed all results (EKG, imaging, lab, urine as appropriate), exam findings with patient. I have reviewed nursing notes and appropriate previous records.  I feel the patient is safe to be discharged home without further emergent workup. Discussed usual and customary return precautions. Patient and family (if present) verbalize understanding and are comfortable with this plan.  Patient will follow-up with their primary care provider. If they do not have a primary care provider, information for follow-up has been provided to them. All questions have been answered.  ____________________________________________  FINAL CLINICAL IMPRESSION(S) / ED DIAGNOSES  Final diagnoses:  Vasovagal syncope  Injury of head, initial encounter    Note:  This document was prepared using Dragon voice recognition software and may include unintentional dictation errors.  Alona BeneJoshua Kendarius Vigen, MD Emergency Medicine    Raegan Sipp, Arlyss RepressJoshua G, MD 05/18/18 Ernestina [id number]

## 2018-05-18 NOTE — ED Notes (Signed)
Patient transported to CT 

## 2018-05-18 NOTE — Discharge Instructions (Signed)

## 2020-05-31 IMAGING — DX DG CHEST 2V
2 series · 2 of 2 positions shown · non-contrast
Comparison: Chest x-ray dated March 17, 2015.

CLINICAL DATA: Cough, congestion, and fever for the past week.

EXAM:
CHEST - 2 VIEW

[chest pa]
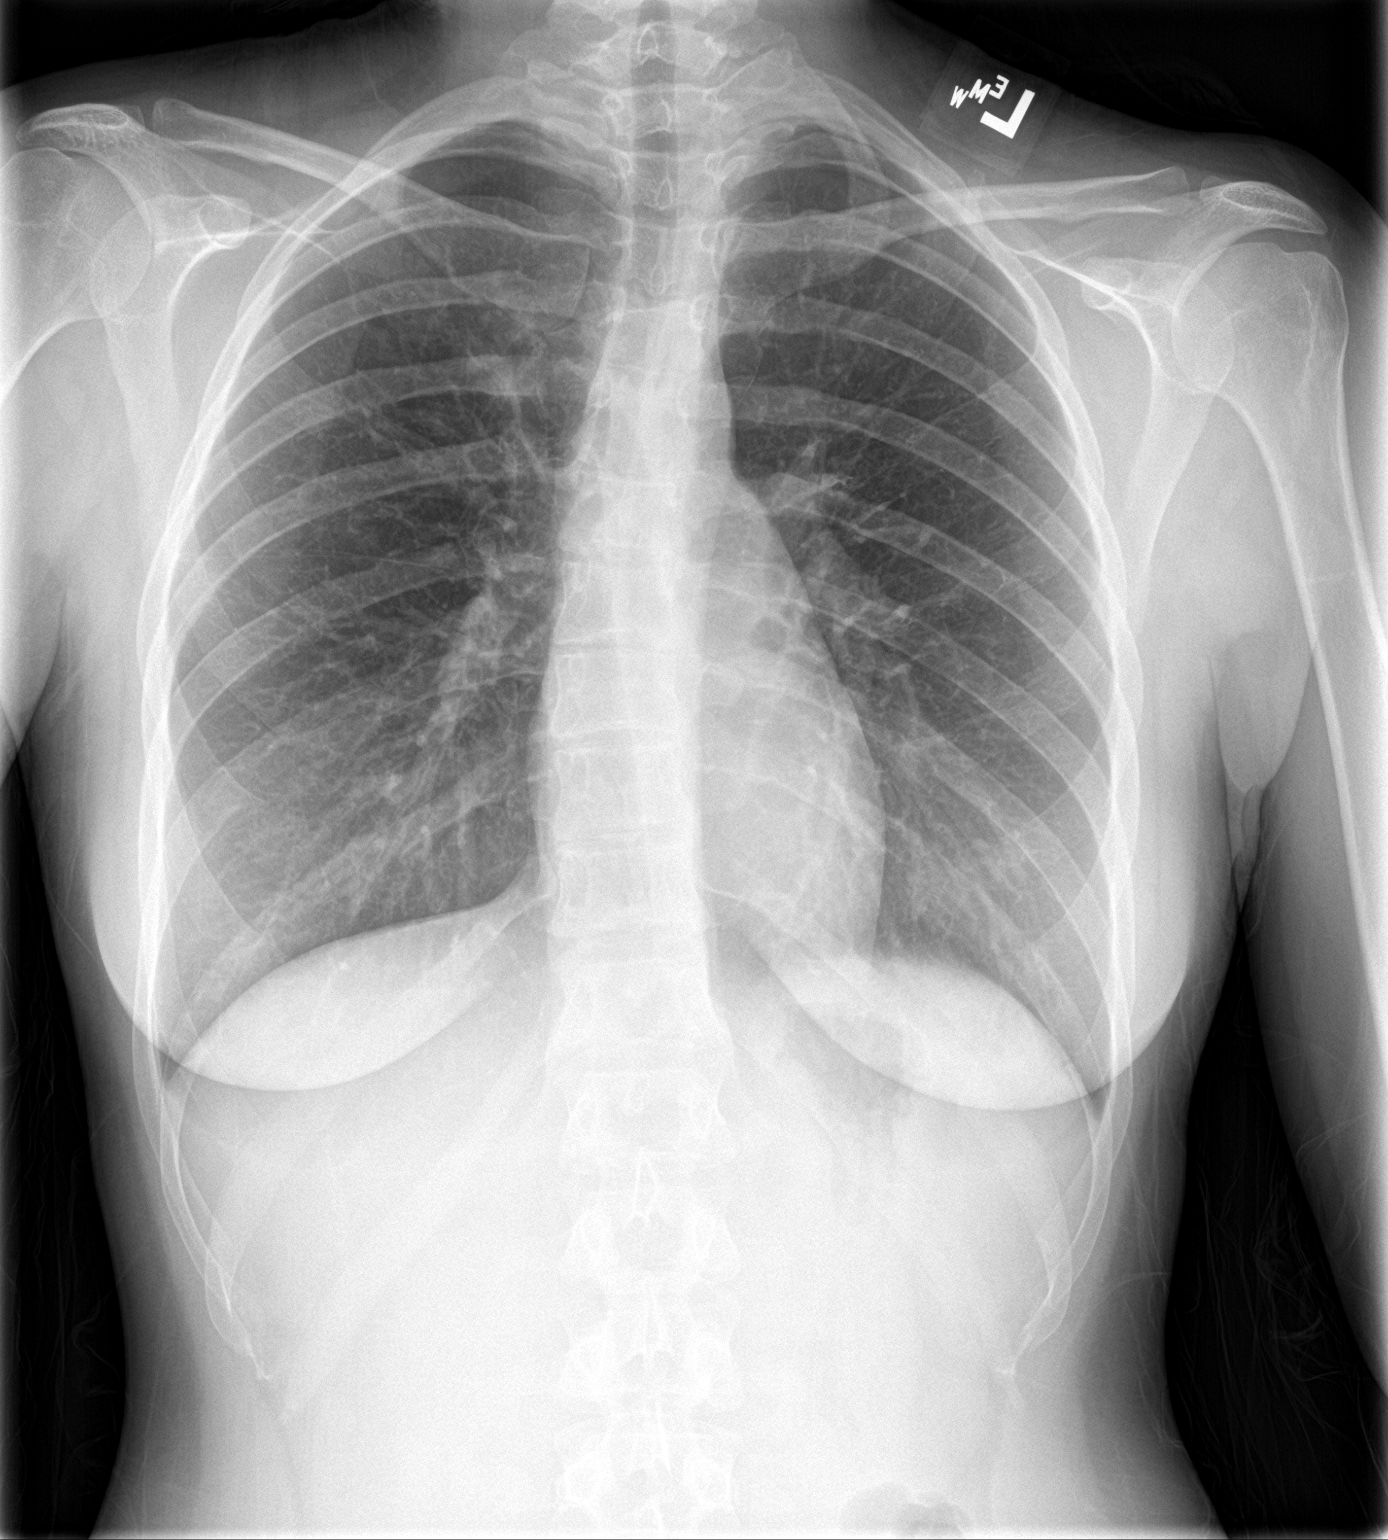

[chest lat]
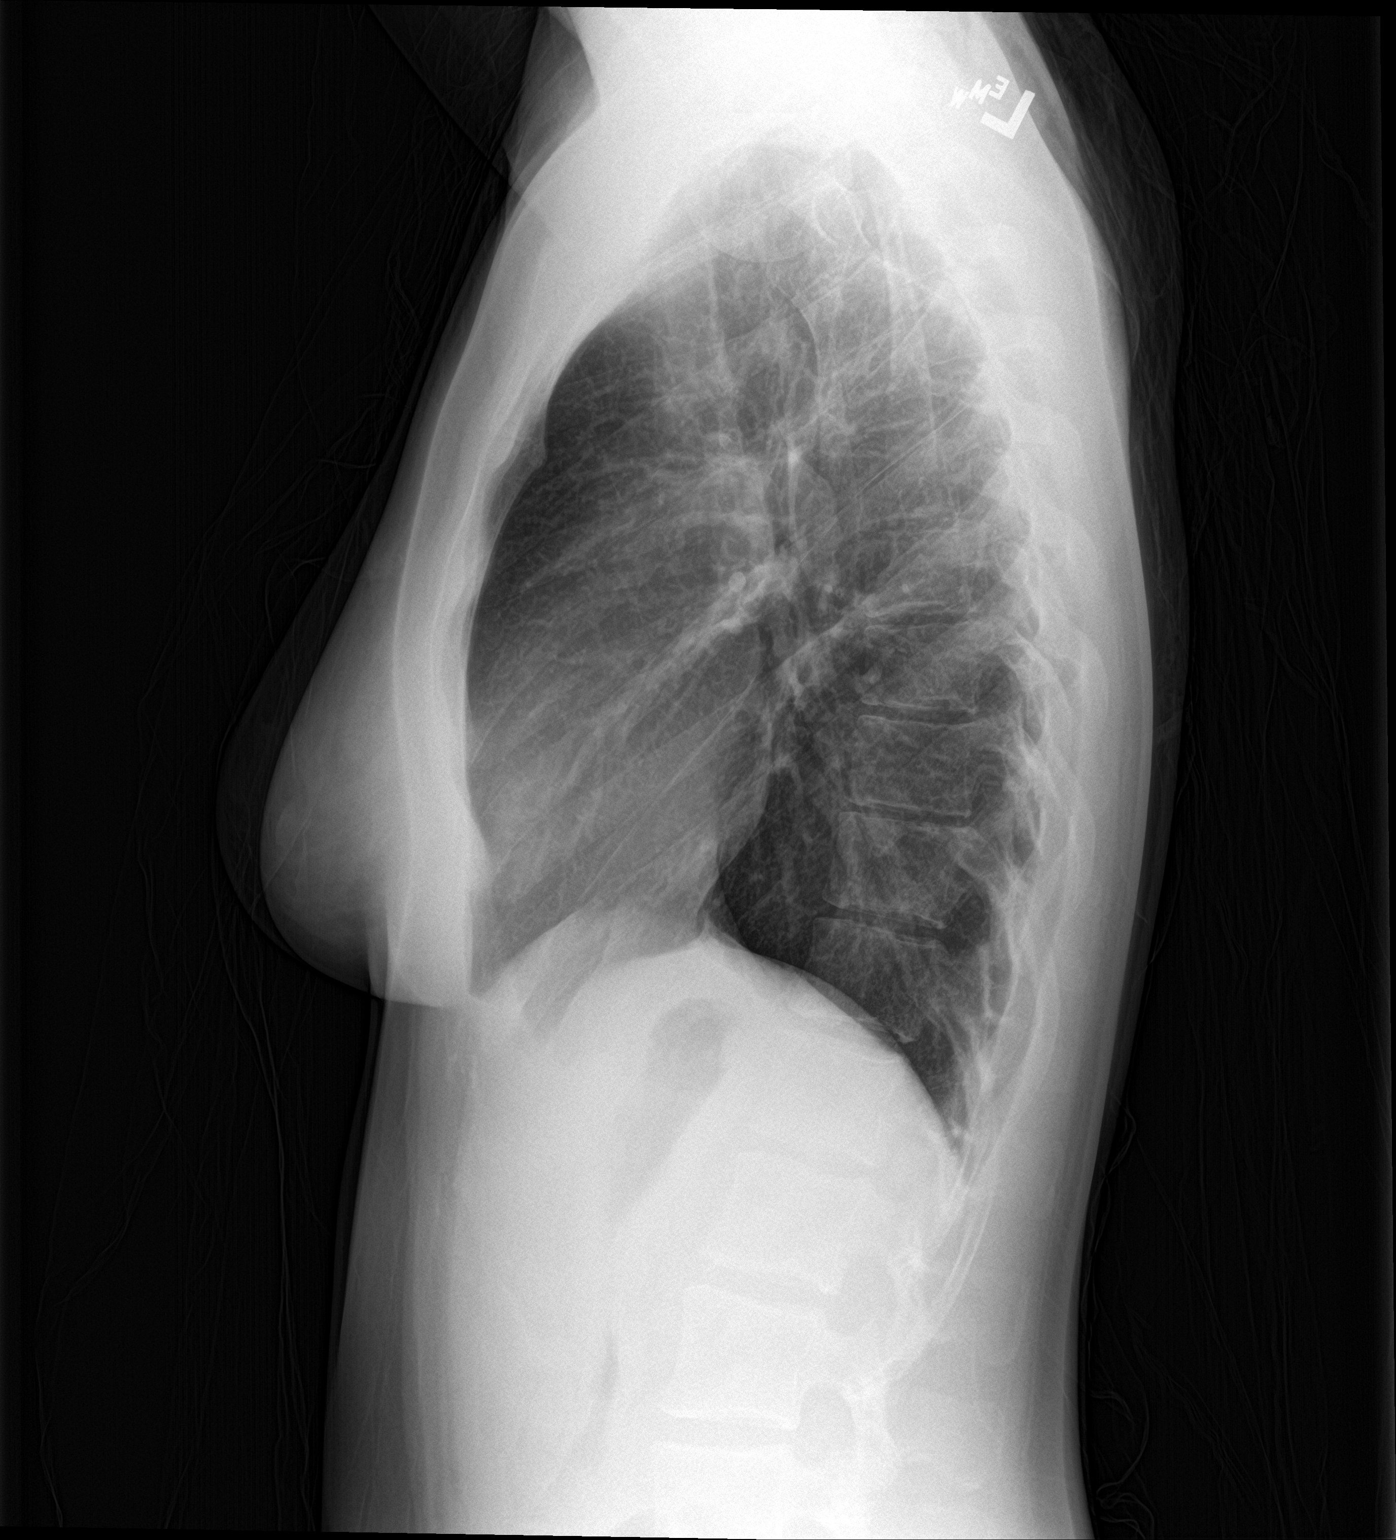

[2 of 2 positions shown; findings below may reference images not displayed]

FINDINGS: The heart size and mediastinal contours are within normal limits.
Normal pulmonary vascularity. Small focal opacity in the right upper
lobe. No pleural effusion or pneumothorax. No acute osseous
abnormality.
IMPRESSION: 1. Small focal opacity in the right upper lobe, suspicious for
pneumonia given clinical history.

## 2021-04-24 ENCOUNTER — Ambulatory Visit: Payer: Self-pay

## 2021-04-24 ENCOUNTER — Emergency Department: Admit: 2021-04-24 | Discharge status: Absent without leave | Payer: Self-pay

## 2021-04-25 ENCOUNTER — Other Ambulatory Visit: Payer: Self-pay

## 2021-04-25 ENCOUNTER — Emergency Department (INDEPENDENT_AMBULATORY_CARE_PROVIDER_SITE_OTHER)
Admission: EM | Admit: 2021-04-25 | Discharge: 2021-04-25 | Disposition: A | Discharge status: Home / Self Care | Payer: Self-pay | Source: Home / Self Care

## 2021-04-25 DIAGNOSIS — Z0489 Encounter for examination and observation for other specified reasons: Secondary | ICD-10-CM
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
## 2021-04-25 NOTE — Discharge Instructions (Signed)
No indication of injury or complications.
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
## 2021-05-07 ENCOUNTER — Ambulatory Visit: Payer: Self-pay

## 2021-05-08 ENCOUNTER — Ambulatory Visit (HOSPITAL_COMMUNITY): Payer: Self-pay

## 2021-05-20 NOTE — L&D Delivery Note (Signed)
Patient was C/C/+2 and pushed for total 1 hour with epidural.     FHTs went to 180s with moderate to severe variables.  Pt pushed to +3 well but variables continued.  D/w pt vacuum assisted delivery for fetal reasons- all risks, benefits and alt d/w pt and she agreed to proceed.  VE applied and pulled with one contraction to crowning and again once outlet, no popoffs.  SVD  female infant, Apgars 8,9, weight P.   The patient had a second degree perineal midline laceration repaired with 2-0 vicryl. Fundus was firm. EBL was expected amount. Placenta was delivered intact. Vagina was clear.  Delayed cord clamping done for 30-60 seconds while warming baby. Baby was vigorous and doing skin to skin with mother.  Loney Laurence

## 2021-06-08 ENCOUNTER — Ambulatory Visit: Payer: Self-pay

## 2021-07-07 ENCOUNTER — Other Ambulatory Visit: Payer: Self-pay

## 2021-07-07 ENCOUNTER — Ambulatory Visit
Admission: EM | Admit: 2021-07-07 | Discharge: 2021-07-07 | Disposition: A | Discharge status: Home / Self Care | Payer: Self-pay

## 2021-08-25 ENCOUNTER — Ambulatory Visit (HOSPITAL_COMMUNITY): Payer: Self-pay

## 2021-08-30 ENCOUNTER — Encounter (HOSPITAL_COMMUNITY): Payer: Self-pay | Admitting: *Deleted

## 2021-08-30 ENCOUNTER — Inpatient Hospital Stay (HOSPITAL_COMMUNITY)
Admission: AD | Admit: 2021-08-30 | Discharge: 2021-08-30 | Disposition: A | Discharge status: Home / Self Care | Payer: Medicaid Other | Attending: Obstetrics and Gynecology | Admitting: Obstetrics and Gynecology

## 2021-08-30 DIAGNOSIS — Z3A09 9 weeks gestation of pregnancy: Secondary | ICD-10-CM | POA: Insufficient documentation

## 2021-08-30 DIAGNOSIS — Z3491 Encounter for supervision of normal pregnancy, unspecified, first trimester: Secondary | ICD-10-CM

## 2021-08-30 DIAGNOSIS — O09291 Supervision of pregnancy with other poor reproductive or obstetric history, first trimester: Secondary | ICD-10-CM | POA: Diagnosis not present

## 2021-08-30 DIAGNOSIS — Z79899 Other long term (current) drug therapy: Secondary | ICD-10-CM | POA: Diagnosis not present

## 2021-08-30 DIAGNOSIS — O218 Other vomiting complicating pregnancy: Secondary | ICD-10-CM | POA: Diagnosis not present

## 2021-08-30 DIAGNOSIS — A084 Viral intestinal infection, unspecified: Secondary | ICD-10-CM | POA: Diagnosis present

## 2021-08-30 HISTORY — DX: Syncope and collapse: R55

## 2021-08-30 LAB — COMPREHENSIVE METABOLIC PANEL
ALT: 13 U/L (ref 0–44)
AST: 16 U/L (ref 15–41)
Albumin: 3.5 g/dL (ref 3.5–5.0)
Alkaline Phosphatase: 52 U/L (ref 38–126)
Anion gap: 10 (ref 5–15)
BUN: 11 mg/dL (ref 6–20)
CO2: 21 mmol/L — ABNORMAL LOW (ref 22–32)
Calcium: 9.1 mg/dL (ref 8.9–10.3)
Chloride: 103 mmol/L (ref 98–111)
Creatinine, Ser: 0.62 mg/dL (ref 0.44–1.00)
GFR, Estimated: >60 mL/min (ref >60)
Glucose, Bld: 103 mg/dL — ABNORMAL HIGH (ref 70–99)
Potassium: 3.5 mmol/L (ref 3.5–5.1)
Sodium: 134 mmol/L — ABNORMAL LOW (ref 135–145)
Total Bilirubin: 0.7 mg/dL (ref 0.3–1.2)
Total Protein: 6.7 g/dL (ref 6.5–8.1)

## 2021-08-30 LAB — URINALYSIS, MICROSCOPIC (REFLEX)

## 2021-08-30 LAB — URINALYSIS, ROUTINE W REFLEX MICROSCOPIC
Bilirubin Urine: NEGATIVE
Glucose, UA: NEGATIVE mg/dL
Hgb urine dipstick: NEGATIVE
Ketones, ur: >80 mg/dL — AB
Nitrite: NEGATIVE
Protein, ur: 30 mg/dL — AB
Specific Gravity, Urine: >1.03 — ABNORMAL HIGH (ref 1.005–1.030)
pH: 5.5 (ref 5.0–8.0)

## 2021-08-30 MED ORDER — LACTATED RINGERS IV BOLUS
1000.0000 mL | Freq: Once | INTRAVENOUS | Status: AC
  Administered 2021-08-30: 1000 mL via INTRAVENOUS

## 2021-08-30 MED ORDER — PROMETHAZINE HCL 25 MG PO TABS: 25.0000 mg | ORAL_TABLET | Freq: Four times a day (QID) | ORAL | 0 refills | Status: DC | PRN

## 2021-08-30 MED ORDER — METOCLOPRAMIDE HCL 5 MG/ML IJ SOLN
10.0000 mg | Freq: Once | INTRAMUSCULAR | Status: AC
  Administered 2021-08-30: 10 mg via INTRAVENOUS
  Filled 2021-08-30: qty 2

## 2021-08-30 MED ORDER — METOCLOPRAMIDE HCL 10 MG PO TABS: 10.0000 mg | ORAL_TABLET | Freq: Four times a day (QID) | ORAL | 0 refills | Status: DC

## 2021-08-30 MED ORDER — ONDANSETRON HCL 40 MG/20ML IJ SOLN
8.0000 mg | Freq: Once | INTRAMUSCULAR | Status: AC
  Administered 2021-08-30: 8 mg via INTRAVENOUS
  Filled 2021-08-30: qty 4

## 2021-08-30 MED ORDER — PROMETHAZINE HCL 25 MG RE SUPP: 25.0000 mg | Freq: Four times a day (QID) | RECTAL | 0 refills | Status: DC | PRN

## 2021-08-30 NOTE — MAU Provider Note (Signed)
?History  ?  ? ?[id number] ? ?Arrival date and time: 08/30/21 0909 ?  ? ?Chief Complaint  ?Patient presents with  ? Fever  ? Emesis  ? Diarrhea  ? ? ? ?HPI ?Debra Stafford is a 25 y.o. at [redacted]w[redacted]d by unsure LMP with PMHx notable for two prior SABs, who presents for nausea, vomiting, and diarrhea.  ? ?Reports she began to have nausea and vomiting in the evening yesterday ?Then began to have diarrhea during the night ?No blood in emesis or diarrhea ?Has had multiple episodes of both ?Has not experienced this much nausea in prior pregnancies ?No vaginal bleeding ?Does endorse mild fever overnight ?Has belly pain from wretching but did not have it previously ?Had one [place] at a crisis pregnancy center in [place] and was told everything looked fine, was supposed to be seen in the office today for a formal US ? ? ?  ? ?OB History   ? ? Gravida  ?3  ? Para  ?0  ? Term  ?0  ? Preterm  ?0  ? AB  ?2  ? Living  ?0  ?  ? ? SAB  ?2  ? IAB  ?0  ? Ectopic  ?0  ? Multiple  ?0  ? Live Births  ?0  ?   ?  ?  ? ? ?Past Medical History:  ?Diagnosis Date  ? Anemia   ? Vasovagal episode   ? ? ?History reviewed. No pertinent surgical history. ? ?Family History  ?Problem Relation Age of Onset  ? Diabetes Mother   ? ? ?Social History  ? ?Socioeconomic History  ? Marital status: Single  ?  Spouse name: Not on file  ? Number of children: Not on file  ? Years of education: Not on file  ? Highest education level: Not on file  ?Occupational History  ? Not on file  ?Tobacco Use  ? Smoking status: Never  ? Smokeless tobacco: Never  ?Vaping Use  ? Vaping Use: Never used  ?Substance and Sexual Activity  ? Alcohol use: Not Currently  ?  Comment: occ  ? Drug use: No  ? Sexual activity: Yes  ?  Birth control/protection: Condom  ?Other Topics Concern  ? Not on file  ?Social History Narrative  ? Not on file  ? ?Social Determinants of Health  ? ?Financial Resource Strain: Not on file  ?Food Insecurity: Not on file  ?Transportation Needs: Not on file  ?Physical  Activity: Not on file  ?Stress: Not on file  ?Social Connections: Not on file  ?Intimate Partner Violence: Not on file  ? ? ?Allergies  ?Allergen Reactions  ? Codeine Hives  ? Sulfamethoxazole-Trimethoprim Hives and Rash  ?  Fever 105F, bleeding ?  ? ? ?No current facility-administered medications on file prior to encounter.  ? ?Current Outpatient Medications on File Prior to Encounter  ?Medication Sig Dispense Refill  ? Prenatal Vit-Fe Fumarate-FA (PRENATAL MULTIVITAMIN) TABS tablet Take 1 tablet by mouth daily at 12 noon.    ? ? ? ?ROS ?Pertinent positives and negative per HPI, all others reviewed and negative ? ?Physical Exam  ? ?BP (!) 95/55 (BP Location: Left Arm)   Pulse (!) 104   Temp 99.4 ?F (37.4 ?C) (Oral)   Resp 16   Ht 5' 5.5" (1.664 m)   Wt 61.4 kg   LMP 04/22/2021   SpO2 100%   BMI 22.17 kg/m?  ? ?Patient Vitals for the past 24 hrs: ? BP Temp Temp src  Pulse Resp SpO2 Height Weight  ?08/30/21 1149 (!) 95/55 -- -- (!) 104 16 -- -- --  ?08/30/21 1000 105/66 -- -- 97 -- -- -- --  ?08/30/21 0939 103/63 99.4 ?F (37.4 ?C) Oral (!) 123 20 100 % 5' 5.5" (1.664 m) 61.4 kg  ? ? ?Physical Exam ?Constitutional:   ?   General: She is not in acute distress. ?   Appearance: She is ill-appearing.  ?Cardiovascular:  ?   Rate and Rhythm: Normal rate.  ?Pulmonary:  ?   Effort: Pulmonary effort is normal. No respiratory distress.  ?Abdominal:  ?   General: Abdomen is flat.  ?   Palpations: Abdomen is soft. There is no mass.  ?   Tenderness: There is no abdominal tenderness.  ?Skin: ?   General: Skin is warm and dry.  ?   Coloration: Skin is pale.  ?Neurological:  ?   General: No focal deficit present.  ?   Mental Status: She is alert.  ?Psychiatric:     ?   Mood and Affect: Mood normal.     ?   Behavior: Behavior normal.  ?  ? ?Cervical Exam ?  ? ?Bedside Ultrasound ?Pt informed that the ultrasound is considered a limited OB ultrasound and is not intended to be a complete ultrasound exam.  Patient also informed  that the ultrasound is not being completed with the intent of assessing for fetal or placental anomalies or any pelvic abnormalities.  Explained that the purpose of today?s ultrasound is to assess for  viability.  Patient acknowledges the purpose of the exam and the limitations of the study.   ? ?My interpretation: viable IUP with subjectively normal FHR and amniotic fluid ? ? ?Labs ?Results for orders placed or performed during the hospital encounter of 08/30/21 (from the past 24 hour(s))  ?Urinalysis, Routine w reflex microscopic Urine, Clean Catch     Status: Abnormal  ? Collection Time: 08/30/21 10:00 AM  ?Result Value Ref Range  ? Color, Urine YELLOW YELLOW  ? APPearance CLEAR CLEAR  ? Specific Gravity, Urine >1.030 (H) 1.005 - 1.030  ? pH 5.5 5.0 - 8.0  ? Glucose, UA NEGATIVE NEGATIVE mg/dL  ? Hgb urine dipstick NEGATIVE NEGATIVE  ? Bilirubin Urine NEGATIVE NEGATIVE  ? Ketones, ur >80 (A) NEGATIVE mg/dL  ? Protein, ur 30 (A) NEGATIVE mg/dL  ? Nitrite NEGATIVE NEGATIVE  ? Leukocytes,Ua TRACE (A) NEGATIVE  ?Urinalysis, Microscopic (reflex)     Status: Abnormal  ? Collection Time: 08/30/21 10:00 AM  ?Result Value Ref Range  ? RBC / HPF 0-5 0 - 5 RBC/hpf  ? WBC, UA 11-20 0 - 5 WBC/hpf  ? Bacteria, UA MANY (A) NONE SEEN  ? Squamous Epithelial / LPF 11-20 0 - 5  ? Mucus PRESENT   ?Comprehensive metabolic panel     Status: Abnormal  ? Collection Time: 08/30/21 10:20 AM  ?Result Value Ref Range  ? Sodium 134 (L) 135 - 145 mmol/L  ? Potassium 3.5 3.5 - 5.1 mmol/L  ? Chloride 103 98 - 111 mmol/L  ? CO2 21 (L) 22 - 32 mmol/L  ? Glucose, Bld 103 (H) 70 - 99 mg/dL  ? BUN 11 6 - 20 mg/dL  ? Creatinine, Ser 0.62 0.44 - 1.00 mg/dL  ? Calcium 9.1 8.9 - 10.3 mg/dL  ? Total Protein 6.7 6.5 - 8.1 g/dL  ? Albumin 3.5 3.5 - 5.0 g/dL  ? AST 16 15 - 41 U/L  ? ALT 13  0 - 44 U/L  ? Alkaline Phosphatase 52 38 - 126 U/L  ? Total Bilirubin 0.7 0.3 - 1.2 mg/dL  ? GFR, Estimated >60 >60 mL/min  ? Anion gap 10 5 - 15  ? ? ?Imaging ?No  results found. ? ?MAU Course  ?Procedures ?Lab Orders    ?     Urinalysis, Routine w reflex microscopic Urine, Clean Catch    ?     Comprehensive metabolic panel    ?     Urinalysis, Microscopic (reflex)    ?Meds ordered this encounter  ?Medications  ? lactated ringers bolus 1,000 mL  ? metoCLOPramide (REGLAN) injection 10 mg  ? ondansetron (ZOFRAN) 8 mg in sodium chloride 0.9 % 50 mL IVPB  ? lactated ringers bolus 1,000 mL  ? promethazine (PHENERGAN) 25 MG tablet  ?  Sig: Take 1 tablet (25 mg total) by mouth every 6 (six) hours as needed for nausea or vomiting.  ?  Dispense:  30 tablet  ?  Refill:  0  ? promethazine (PHENERGAN) 25 MG suppository  ?  Sig: Place 1 suppository (25 mg total) rectally every 6 (six) hours as needed for nausea or vomiting.  ?  Dispense:  12 each  ?  Refill:  0  ? metoCLOPramide (REGLAN) 10 MG tablet  ?  Sig: Take 1 tablet (10 mg total) by mouth every 6 (six) hours.  ?  Dispense:  30 tablet  ?  Refill:  0  ? ?Imaging Orders  ?No imaging studies ordered today  ? ? ?MDM ?moderate ? ?Assessment and Plan  ?#Viral gastroenteritis ?Symptoms c/w viral gastro, viable IUP seen on BSUS, ruled out for ectopic. Likely work exposure. Much improved with antiemetics and IVF as above. Rx sent for home meds, given work letter, symptomatic treatment and tincture of time.  ? ? ?Dispo: discharged to home in stable condition. ? ? ?Venora MaplesMatthew M Alliyah Roesler, MD/MPH ?08/30/21 ?1:14 PM ? ?Allergies as of 08/30/2021   ? ?   Reactions  ? Codeine Hives  ? Sulfamethoxazole-trimethoprim Hives, Rash  ? Fever 105F, bleeding  ? ?  ? ?  ?Medication List  ?  ? ?STOP taking these medications   ? ?azithromycin 250 MG tablet ?Commonly known as: ZITHROMAX ?  ?benzonatate 100 MG capsule ?Commonly known as: TESSALON ?  ?erythromycin ophthalmic ointment ?  ? ?  ? ?TAKE these medications   ? ?metoCLOPramide 10 MG tablet ?Commonly known as: REGLAN ?Take 1 tablet (10 mg total) by mouth every 6 (six) hours. ?  ?prenatal multivitamin Tabs  tablet ?Take 1 tablet by mouth daily at 12 noon. ?  ?promethazine 25 MG tablet ?Commonly known as: PHENERGAN ?Take 1 tablet (25 mg total) by mouth every 6 (six) hours as needed for nausea or vomiting. ?  ?promethazine 25 MG sup

## 2021-08-30 NOTE — MAU Note (Signed)
Debra Stafford is a 26 y.o. at 9.3wks,  here in MAU reporting: last 2 days, she hads been throwing up every time she eats or drinks.  Last night at 5 she started and continued throwing up "every 30 min, literally all night long".  Can't keep anything down.  Reports diarrhea, "straight water" started at 2000.  Little fever, 101.6 this morning.  Is preg, has been seen at dr's office.   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 ?Onset of complaint: 2 days ago ?Pain score: 7 ?Vitals:  ? 08/30/21 0939  ?BP: 103/63  ?Pulse: (!) 123  ?Resp: 20  ?Temp: 99.4 ?F (37.4 ?C)  ?SpO2: 100%  ?   ?Lab orders placed from triage:  urine, contact isolation ?

## 2021-09-05 LAB — HEPATITIS C ANTIBODY: HCV Ab: NEGATIVE

## 2021-09-05 LAB — OB RESULTS CONSOLE HIV ANTIBODY (ROUTINE TESTING)
HIV: NONREACTIVE
HIV: NONREACTIVE

## 2021-09-05 LAB — OB RESULTS CONSOLE GC/CHLAMYDIA
Chlamydia: NEGATIVE
Neisseria Gonorrhea: NEGATIVE

## 2021-09-05 LAB — OB RESULTS CONSOLE VARICELLA ZOSTER ANTIBODY, IGG: Varicella: IMMUNE

## 2021-09-05 LAB — OB RESULTS CONSOLE RUBELLA ANTIBODY, IGM
Rubella: IMMUNE
Rubella: IMMUNE

## 2021-09-05 LAB — OB RESULTS CONSOLE RPR: RPR: NONREACTIVE

## 2021-09-05 LAB — OB RESULTS CONSOLE HEPATITIS B SURFACE ANTIGEN
Hepatitis B Surface Ag: NEGATIVE
Hepatitis B Surface Ag: NEGATIVE

## 2021-09-05 LAB — OB RESULTS CONSOLE ANTIBODY SCREEN: Antibody Screen: NEGATIVE

## 2021-11-14 ENCOUNTER — Other Ambulatory Visit: Payer: Self-pay

## 2021-11-14 ENCOUNTER — Inpatient Hospital Stay (HOSPITAL_COMMUNITY)
Admission: AD | Admit: 2021-11-14 | Discharge: 2021-11-14 | Disposition: A | Discharge status: Home / Self Care | Payer: Medicaid Other | Attending: Obstetrics and Gynecology | Admitting: Obstetrics and Gynecology

## 2021-11-14 ENCOUNTER — Encounter (HOSPITAL_COMMUNITY): Payer: Self-pay | Admitting: Obstetrics and Gynecology

## 2021-11-14 DIAGNOSIS — O26852 Spotting complicating pregnancy, second trimester: Secondary | ICD-10-CM | POA: Diagnosis not present

## 2021-11-14 DIAGNOSIS — W500XXA Accidental hit or strike by another person, initial encounter: Secondary | ICD-10-CM | POA: Insufficient documentation

## 2021-11-14 DIAGNOSIS — R109 Unspecified abdominal pain: Secondary | ICD-10-CM | POA: Insufficient documentation

## 2021-11-14 DIAGNOSIS — Y93F9 Activity, other caregiving: Secondary | ICD-10-CM | POA: Insufficient documentation

## 2021-11-14 DIAGNOSIS — Y92129 Unspecified place in nursing home as the place of occurrence of the external cause: Secondary | ICD-10-CM | POA: Diagnosis not present

## 2021-11-14 DIAGNOSIS — O9A212 Injury, poisoning and certain other consequences of external causes complicating pregnancy, second trimester: Secondary | ICD-10-CM | POA: Diagnosis not present

## 2021-11-14 DIAGNOSIS — S3991XA Unspecified injury of abdomen, initial encounter: Secondary | ICD-10-CM

## 2021-11-14 DIAGNOSIS — Z3A20 20 weeks gestation of pregnancy: Secondary | ICD-10-CM | POA: Insufficient documentation

## 2021-11-14 DIAGNOSIS — O368120 Decreased fetal movements, second trimester, not applicable or unspecified: Secondary | ICD-10-CM | POA: Insufficient documentation

## 2021-11-14 DIAGNOSIS — O26892 Other specified pregnancy related conditions, second trimester: Secondary | ICD-10-CM | POA: Diagnosis not present

## 2021-11-14 LAB — URINALYSIS, ROUTINE W REFLEX MICROSCOPIC
Bilirubin Urine: NEGATIVE
Glucose, UA: NEGATIVE mg/dL
Hgb urine dipstick: NEGATIVE
Ketones, ur: NEGATIVE mg/dL
Nitrite: NEGATIVE
Protein, ur: NEGATIVE mg/dL
Specific Gravity, Urine: 1.017 (ref 1.005–1.030)
pH: 5 (ref 5.0–8.0)
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
## 2021-11-14 NOTE — Progress Notes (Signed)
Debra Stafford CNM in earlier to discuss d/c plan. Written and verbal d/c instructions given and understanding voiced. 

## 2021-11-15 LAB — CULTURE, OB URINE: Culture: 30000 — AB

## 2022-01-14 ENCOUNTER — Non-Acute Institutional Stay (HOSPITAL_COMMUNITY)
Admission: RE | Admit: 2022-01-14 | Discharge: 2022-01-14 | Disposition: A | Discharge status: Home / Self Care | Payer: Medicaid Other | Source: Ambulatory Visit | Attending: Internal Medicine | Admitting: Internal Medicine

## 2022-01-14 DIAGNOSIS — O99019 Anemia complicating pregnancy, unspecified trimester: Secondary | ICD-10-CM | POA: Diagnosis present

## 2022-01-14 DIAGNOSIS — Z3A00 Weeks of gestation of pregnancy not specified: Secondary | ICD-10-CM | POA: Diagnosis not present

## 2022-01-14 MED ORDER — SODIUM CHLORIDE 0.9 % IV SOLN
INTRAVENOUS | Status: DC | PRN
Start: 2022-01-14 — End: 2022-01-15

## 2022-01-14 MED ORDER — SODIUM CHLORIDE 0.9 % IV SOLN
200.0000 mg | Freq: Once | INTRAVENOUS | Status: AC
  Administered 2022-01-14: 200 mg via INTRAVENOUS
  Filled 2022-01-14: qty 10

## 2022-01-14 NOTE — Progress Notes (Signed)
PATIENT CARE CENTER NOTE   Diagnosis: Anemia complicating pregnancy, unspecified trimester O99.019   Provider: Ellison Hughs, MD   Procedure: Venofer 200 mg    Note:  Patient received Venofer 200 mg (dose # 1 of 2) via PIV. No pre-medications ordered. Patient tolerated infusion well with no adverse reaction. Observed patient for 30 minutes post infusion. Vital signs stable. Discharge instructions given. Patient to come back next week for second infusion and will schedule next appointment at the front desk. Patient alert, oriented and ambulatory at discharge.

## 2022-01-22 ENCOUNTER — Non-Acute Institutional Stay (HOSPITAL_COMMUNITY)
Admission: RE | Admit: 2022-01-22 | Discharge: 2022-01-22 | Disposition: A | Discharge status: Home / Self Care | Payer: Medicaid Other | Source: Ambulatory Visit | Attending: Internal Medicine | Admitting: Internal Medicine

## 2022-01-22 DIAGNOSIS — O99019 Anemia complicating pregnancy, unspecified trimester: Secondary | ICD-10-CM | POA: Diagnosis present

## 2022-01-22 MED ORDER — SODIUM CHLORIDE 0.9 % IV SOLN
200.0000 mg | Freq: Once | INTRAVENOUS | Status: AC
  Administered 2022-01-22: 200 mg via INTRAVENOUS
  Filled 2022-01-22: qty 10

## 2022-01-22 MED ORDER — SODIUM CHLORIDE 0.9 % IV SOLN
INTRAVENOUS | Status: DC | PRN
Start: 2022-01-22 — End: 2022-01-23

## 2022-01-22 NOTE — Progress Notes (Signed)
PATIENT CARE CENTER NOTE     Diagnosis: Anemia complicating pregnancy, unspecified trimester O99.019     Provider: Ellison Hughs, MD     Procedure: Venofer 200 mg      Note:  Patient received Venofer 200 mg infusion (dose # 2 of 2) via PIV. No pre-medications ordered. Patient tolerated infusion well with no adverse reaction. Observed patient for 30 minutes post infusion. Vital signs stable. AVS offered but patient declined. Patient alert, oriented and ambulatory at discharge.

## 2022-01-29 ENCOUNTER — Other Ambulatory Visit: Payer: Self-pay

## 2022-01-29 ENCOUNTER — Observation Stay (HOSPITAL_COMMUNITY)
Admission: AD | Admit: 2022-01-29 | Discharge: 2022-01-30 | Disposition: A | Discharge status: Home / Self Care | Payer: Medicaid Other | Attending: Obstetrics and Gynecology | Admitting: Obstetrics and Gynecology

## 2022-01-29 ENCOUNTER — Inpatient Hospital Stay (HOSPITAL_BASED_OUTPATIENT_CLINIC_OR_DEPARTMENT_OTHER): Payer: Medicaid Other

## 2022-01-29 ENCOUNTER — Encounter (HOSPITAL_COMMUNITY): Payer: Self-pay | Admitting: Obstetrics and Gynecology

## 2022-01-29 DIAGNOSIS — W010XXA Fall on same level from slipping, tripping and stumbling without subsequent striking against object, initial encounter: Secondary | ICD-10-CM | POA: Diagnosis not present

## 2022-01-29 DIAGNOSIS — R1033 Periumbilical pain: Secondary | ICD-10-CM | POA: Diagnosis not present

## 2022-01-29 DIAGNOSIS — W109XXA Fall (on) (from) unspecified stairs and steps, initial encounter: Secondary | ICD-10-CM

## 2022-01-29 DIAGNOSIS — W19XXXA Unspecified fall, initial encounter: Secondary | ICD-10-CM | POA: Diagnosis not present

## 2022-01-29 DIAGNOSIS — R109 Unspecified abdominal pain: Secondary | ICD-10-CM | POA: Insufficient documentation

## 2022-01-29 DIAGNOSIS — Z3A31 31 weeks gestation of pregnancy: Secondary | ICD-10-CM | POA: Diagnosis not present

## 2022-01-29 DIAGNOSIS — S3991XA Unspecified injury of abdomen, initial encounter: Secondary | ICD-10-CM | POA: Insufficient documentation

## 2022-01-29 DIAGNOSIS — R10813 Right lower quadrant abdominal tenderness: Secondary | ICD-10-CM

## 2022-01-29 DIAGNOSIS — O26893 Other specified pregnancy related conditions, third trimester: Secondary | ICD-10-CM | POA: Diagnosis not present

## 2022-01-29 DIAGNOSIS — O9A213 Injury, poisoning and certain other consequences of external causes complicating pregnancy, third trimester: Principal | ICD-10-CM | POA: Diagnosis present

## 2022-01-29 LAB — CBC
HCT: 29.7 % — ABNORMAL LOW (ref 36.0–46.0)
Hemoglobin: 9.3 g/dL — ABNORMAL LOW (ref 12.0–15.0)
MCH: 28.4 pg (ref 26.0–34.0)
MCHC: 31.3 g/dL (ref 30.0–36.0)
MCV: 90.8 fL (ref 80.0–100.0)
Platelets: 246 10*3/uL (ref 150–400)
RBC: 3.27 MIL/uL — ABNORMAL LOW (ref 3.87–5.11)
RDW: 16.3 % — ABNORMAL HIGH (ref 11.5–15.5)
WBC: 12.5 10*3/uL — ABNORMAL HIGH (ref 4.0–10.5)
nRBC: 0 % (ref 0.0–0.2)

## 2022-01-29 LAB — URINALYSIS, ROUTINE W REFLEX MICROSCOPIC
Bilirubin Urine: NEGATIVE
Glucose, UA: NEGATIVE mg/dL
Hgb urine dipstick: NEGATIVE
Ketones, ur: NEGATIVE mg/dL
Nitrite: NEGATIVE
Protein, ur: NEGATIVE mg/dL
Specific Gravity, Urine: 1.02 (ref 1.005–1.030)
WBC, UA: >50 WBC/hpf — ABNORMAL HIGH (ref 0–5)
pH: 5 (ref 5.0–8.0)

## 2022-01-29 LAB — KLEIHAUER-BETKE STAIN
Fetal Cells %: 0 %
Quantitation Fetal Hemoglobin: 0 mL

## 2022-01-29 LAB — TYPE AND SCREEN
ABO/RH(D): O POS
Antibody Screen: NEGATIVE

## 2022-01-29 LAB — ABO/RH: ABO/RH(D): O POS

## 2022-01-29 MED ORDER — CALCIUM CARBONATE ANTACID 500 MG PO CHEW: 2.0000 | CHEWABLE_TABLET | ORAL | Status: DC | PRN

## 2022-01-29 MED ORDER — PRENATAL MULTIVITAMIN CH: 1.0000 | ORAL_TABLET | Freq: Every day | ORAL | Status: DC

## 2022-01-29 MED ORDER — DOCUSATE SODIUM 100 MG PO CAPS: 100.0000 mg | ORAL_CAPSULE | Freq: Every day | ORAL | Status: DC

## 2022-01-29 MED ORDER — ZOLPIDEM TARTRATE 5 MG PO TABS
5.0000 mg | ORAL_TABLET | Freq: Every evening | ORAL | Status: DC | PRN
Start: 2022-01-29 — End: 2022-01-30

## 2022-01-29 MED ORDER — LACTATED RINGERS IV BOLUS
1000.0000 mL | Freq: Once | INTRAVENOUS | Status: AC
  Administered 2022-01-29: 1000 mL via INTRAVENOUS

## 2022-01-29 MED ORDER — ACETAMINOPHEN 325 MG PO TABS: 650.0000 mg | ORAL_TABLET | ORAL | Status: DC | PRN

## 2022-01-29 NOTE — MAU Note (Signed)
Debra Stafford is a 26 y.o. at [redacted]w[redacted]d here in MAU reporting: around 40 min ago was taking her dog out to the bathroom and when she was going to close the door her dog took off and pulled her down the stairs. States she hit her abdomen on one of the steps. Is now having some cramping. No bleeding or LOF. +FM  Onset of complaint: today  Pain score: 6/10  Vitals:   01/29/22 1542  BP: 104/68  Pulse: (!) 109  Resp: 16  Temp: 98.3 F (36.8 C)  SpO2: 96%     FHT:158  Lab orders placed from triage: ua

## 2022-01-29 NOTE — H&P (Addendum)
Debra Stafford is a 26 y.o. female G3P0020 at 45 1/7 weeks (EDD 04/02/22 by LMP c/w 10 week Korea)  presenting to MAU after fell down stairs and hit abdomen.  MAU provider monitored baby which is category 1 and ordered an [place] to assess placenta which showed some venous flow behind a posterior placenta.  D/w Dr. Parke Poisson who feels this is likely normal venous flow, and NOT an abruption, but recommends admitting patient to observation overnight.   Prenatal care significant for anemia with hemoglobin of 8.6 at 28 weeks and has received iron infusions x 2  OB History     Gravida  3   Para  0   Term  0   Preterm  0   AB  2   Living  0      SAB  2   IAB  0   Ectopic  0   Multiple  0   Live Births  0          Past Medical History:  Diagnosis Date   Anemia    Vasovagal episode    History reviewed. No pertinent surgical history. Family History: family history includes Diabetes in her mother. Social History:  reports that she has never smoked. She has never used smokeless tobacco. She reports that she does not currently use alcohol. She reports that she does not use drugs.     Maternal Diabetes: No Genetic Screening: Normal first trimester screen Maternal Ultrasounds/Referrals: Isolated EIF (echogenic intracardiac focus) Fetal Ultrasounds or other Referrals:  None Maternal Substance Abuse:  No Significant Maternal Medications:  None Significant Maternal Lab Results:  None Number of Prenatal Visits:greater than 3 verified prenatal visits Other Comments:  None  Review of Systems  Constitutional:  Negative for fever.  Gastrointestinal:  Positive for abdominal pain.  Genitourinary:  Negative for vaginal bleeding.   Maternal Medical History:  Contractions: Perceived severity is mild.   Fetal activity: Perceived fetal activity is normal.   Prenatal complications: Anemia Prenatal Complications - Diabetes: none.     Blood pressure 103/60, pulse (!) 113, temperature 98.3 F (36.8  C), temperature source Oral, resp. rate 18, height 5\' 6"  (1.676 m), weight 69.9 kg, last menstrual period 04/22/2021, SpO2 97 %. Maternal Exam:  Uterine Assessment: Contraction strength is mild.    Physical Exam Cardiovascular:     Rate and Rhythm: Normal rate and regular rhythm.  Pulmonary:     Effort: Pulmonary effort is normal.  Abdominal:     Comments: Soft, gravid NT  Skin:    General: Skin is warm.  Neurological:     Mental Status: She is alert.  Psychiatric:        Mood and Affect: Mood normal.     Prenatal labs: ABO, Rh:  O positive Antibody:  negative Rubella:  Immune RPR:   NR HBsAg:   Neg HIV:   NR GBS:   unknown One hour GTC 96 First trimester screen negative Carrier screen negative x 2  Assessment/Plan: D/w Dr. 14/08/2020 who has low suspicion  for abruption but recommends monitoring overnight and repeating placenta scan in AM.  Will check CBC and K-B as well as get T&S.  Continuous monitoring. Dr. Parke Poisson informed and ok with admission given low chance of delivery even though NICU in red.    Eric Form 01/29/2022, 5:05 PM
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
## 2022-01-29 NOTE — Progress Notes (Signed)
Patient ID: Debra Stafford, female   DOB: 1996/01/31, 26 y.o.   MRN: [id number] Pt admitted and reports some mild cramping in lower pelvis and back pain.  More constant than coming and going.  Baby active. No VB or LOF.  Afeb vss    FHR reactive with no decels  Hgb 9.3  KB pending  FHR looks normal, will keep on continuous monitoring and repeat US with MFM ordered for AM D/w pt plan and she is agreeable.

## 2022-01-30 ENCOUNTER — Observation Stay (HOSPITAL_BASED_OUTPATIENT_CLINIC_OR_DEPARTMENT_OTHER): Payer: Medicaid Other

## 2022-01-30 DIAGNOSIS — Z3A31 31 weeks gestation of pregnancy: Secondary | ICD-10-CM | POA: Diagnosis not present

## 2022-01-30 DIAGNOSIS — W010XXA Fall on same level from slipping, tripping and stumbling without subsequent striking against object, initial encounter: Secondary | ICD-10-CM

## 2022-01-30 DIAGNOSIS — O9A213 Injury, poisoning and certain other consequences of external causes complicating pregnancy, third trimester: Secondary | ICD-10-CM | POA: Diagnosis not present

## 2022-01-30 DIAGNOSIS — W109XXA Fall (on) (from) unspecified stairs and steps, initial encounter: Secondary | ICD-10-CM

## 2022-01-30 LAB — CULTURE, OB URINE: Culture: NO GROWTH
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
## 2022-03-05 LAB — OB RESULTS CONSOLE GC/CHLAMYDIA
Chlamydia: NEGATIVE
Neisseria Gonorrhea: NEGATIVE

## 2022-03-05 LAB — OB RESULTS CONSOLE GBS: GBS: NEGATIVE

## 2022-03-07 ENCOUNTER — Encounter (HOSPITAL_COMMUNITY): Payer: Self-pay | Admitting: Obstetrics & Gynecology

## 2022-03-07 ENCOUNTER — Inpatient Hospital Stay (HOSPITAL_COMMUNITY)
Admission: AD | Admit: 2022-03-07 | Discharge: 2022-03-07 | Disposition: A | Discharge status: Home / Self Care | Payer: No Typology Code available for payment source | Attending: Obstetrics & Gynecology | Admitting: Obstetrics & Gynecology

## 2022-03-07 DIAGNOSIS — O368130 Decreased fetal movements, third trimester, not applicable or unspecified: Secondary | ICD-10-CM | POA: Insufficient documentation

## 2022-03-07 DIAGNOSIS — Z3A36 36 weeks gestation of pregnancy: Secondary | ICD-10-CM | POA: Insufficient documentation

## 2022-03-07 DIAGNOSIS — O4703 False labor before 37 completed weeks of gestation, third trimester: Secondary | ICD-10-CM

## 2022-03-07 DIAGNOSIS — O9A213 Injury, poisoning and certain other consequences of external causes complicating pregnancy, third trimester: Secondary | ICD-10-CM | POA: Diagnosis not present

## 2022-03-07 HISTORY — DX: Urinary tract infection, site not specified: N39.0

## 2022-03-07 HISTORY — DX: Headache, unspecified: R51.9

## 2022-03-07 HISTORY — DX: Anxiety disorder, unspecified: F41.9

## 2022-03-07 LAB — URINALYSIS, ROUTINE W REFLEX MICROSCOPIC
Bilirubin Urine: NEGATIVE
Glucose, UA: NEGATIVE mg/dL
Hgb urine dipstick: NEGATIVE
Ketones, ur: NEGATIVE mg/dL
Nitrite: NEGATIVE
Protein, ur: NEGATIVE mg/dL
Specific Gravity, Urine: 1.005 (ref 1.005–1.030)
pH: 5 (ref 5.0–8.0)

## 2022-03-07 NOTE — Discharge Instructions (Signed)
Return to MAU: If you have heavier bleeding that soaks through more that 2 pads per hour for an hour or more If you bleed so much that you feel like you might pass out or you do pass out If you have significant abdominal pain that is not improved with Tylenol 1000 mg every 8 hours as needed for pain If you develop a fever > 100.5  
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
## 2022-03-17 ENCOUNTER — Inpatient Hospital Stay (HOSPITAL_COMMUNITY)
Admission: AD | Admit: 2022-03-17 | Discharge: 2022-03-17 | Disposition: A | Discharge status: Home / Self Care | Payer: Medicaid Other | Attending: Obstetrics and Gynecology | Admitting: Obstetrics and Gynecology

## 2022-03-17 ENCOUNTER — Encounter (HOSPITAL_COMMUNITY): Payer: Self-pay | Admitting: Obstetrics and Gynecology

## 2022-03-17 DIAGNOSIS — O471 False labor at or after 37 completed weeks of gestation: Secondary | ICD-10-CM | POA: Diagnosis present

## 2022-03-17 DIAGNOSIS — O479 False labor, unspecified: Secondary | ICD-10-CM

## 2022-03-17 DIAGNOSIS — Z3A37 37 weeks gestation of pregnancy: Secondary | ICD-10-CM | POA: Diagnosis not present

## 2022-03-17 NOTE — MAU Provider Note (Signed)
Ms. Debra Stafford is a G3P0020 at [redacted]w[redacted]d seen in MAU for labor. RN labor check, not seen by provider. SVE by RN Dilation: 1 Effacement (%): 50 Cervical Position: Posterior Station: -3 Presentation: Vertex Exam by:: Dr. Wilhelmenia Blase   NST - FHR: 150 bpm / moderate variability / accels present / decels absent / TOCO: irregular   Plan:  D/C home with labor precautions Keep scheduled appt with OB provider.   Concepcion Living, MD  03/17/2022 11:36 PM

## 2022-03-17 NOTE — MAU Note (Signed)
.  Debra Stafford is a 26 y.o. at [redacted]w[redacted]d here in MAU reporting: Pt states that she felt like she over did it today by cleaning since 1000. Pt states ctx were 20-30 min apart and now they are about 10-20 min apart. Pt states she feels more pressure since she's been here and has to pee often. Pt states No LOF but some pink spotting. +FM. Next Tuesday she was supposed to get membrane sweep.   Pain score: 5/10 ctx lower abdomen.   FHT:147 Lab orders placed from triage:   labor eval.

## 2022-03-18 ENCOUNTER — Telehealth (HOSPITAL_COMMUNITY): Payer: Self-pay

## 2022-03-18 NOTE — Telephone Encounter (Signed)
Preadmission screening 

## 2022-03-19 ENCOUNTER — Encounter (HOSPITAL_COMMUNITY): Payer: Self-pay | Admitting: Obstetrics and Gynecology

## 2022-03-19 ENCOUNTER — Telehealth (HOSPITAL_COMMUNITY): Payer: Self-pay | Admitting: *Deleted

## 2022-03-19 ENCOUNTER — Inpatient Hospital Stay (HOSPITAL_COMMUNITY)
Admission: AD | Admit: 2022-03-19 | Discharge: 2022-03-20 | Disposition: A | Discharge status: Home / Self Care | Payer: Medicaid Other | Attending: Obstetrics and Gynecology | Admitting: Obstetrics and Gynecology

## 2022-03-19 DIAGNOSIS — Z3A38 38 weeks gestation of pregnancy: Secondary | ICD-10-CM | POA: Insufficient documentation

## 2022-03-19 DIAGNOSIS — O471 False labor at or after 37 completed weeks of gestation: Secondary | ICD-10-CM | POA: Insufficient documentation

## 2022-03-19 DIAGNOSIS — O479 False labor, unspecified: Secondary | ICD-10-CM

## 2022-03-19 NOTE — MAU Note (Signed)
Debra Stafford is a 26 y.o. at [redacted]w[redacted]d here in MAU reporting: irregular ctx. Pt states when she is up and moving she thinks they are every 5 minutes. Pt states she is having severe cramping as well. Pt states she got her membranes swept today. Pt states she was 1-2cm in the office today. Pt reports vaginal bleeding that started at 1430 today. Pt states she sees small clots with the vaginal bleeding. Pt states she is feeling her baby less today than normal. No LOF   Onset of complaint: 03/19/2022 Pain score: ctx 7.5/10 Cramping 8/10 Vitals:   03/19/22 2345  BP: (!) 96/55  Pulse: 92  Resp: 16  Temp: 98.9 F (37.2 C)  SpO2: 98%     FHT:138 Lab orders placed from triage:

## 2022-03-19 NOTE — Telephone Encounter (Signed)
Preadmission screen  

## 2022-03-20 ENCOUNTER — Encounter (HOSPITAL_COMMUNITY): Payer: Self-pay

## 2022-03-20 DIAGNOSIS — Z3A38 38 weeks gestation of pregnancy: Secondary | ICD-10-CM | POA: Diagnosis not present

## 2022-03-20 DIAGNOSIS — O479 False labor, unspecified: Secondary | ICD-10-CM | POA: Diagnosis not present

## 2022-03-20 DIAGNOSIS — O471 False labor at or after 37 completed weeks of gestation: Secondary | ICD-10-CM | POA: Diagnosis not present

## 2022-03-20 NOTE — MAU Provider Note (Signed)
S: Ms. Debra Stafford is a 26 y.o. G3P0020 at [redacted]w[redacted]d  who presents to MAU today for labor evaluation.     Cervical exam by RN:  Dilation: 1 Effacement (%): 50 Cervical Position: Posterior Station: -3 Presentation: Vertex Exam by:: Elray Mcgregor, RN  Fetal Monitoring: Baseline: 140 Variability: average Accelerations: present Decelerations: absent,  FHR traces maternal when patient leans forward Contractions: irregular  Patient does not want to be rechecked in an hour, wants to go home  MDM Discussed patient with RN. NST reviewed.   A: SIUP at [redacted]w[redacted]d  False labor  P: Discharge home Labor precautions and kick counts included in AVS Patient to follow-up with office as scheduled  Patient may return to MAU as needed or when in labor   Seabron Spates, [place] 03/20/2022 12:35 AM

## 2022-03-20 NOTE — MAU Note (Signed)
RN gave pt clicker to track fetal movement. Pt states she does feel her baby move just not during a ctx. CNM aware and allowed RN to call 1st call labor and delivery to discuss pt plan of care.

## 2022-03-21 ENCOUNTER — Telehealth (HOSPITAL_COMMUNITY): Payer: Self-pay | Admitting: *Deleted

## 2022-03-21 ENCOUNTER — Encounter (HOSPITAL_COMMUNITY): Payer: Self-pay | Admitting: *Deleted

## 2022-03-21 NOTE — Telephone Encounter (Signed)
Preadmission screen  

## 2022-03-27 ENCOUNTER — Inpatient Hospital Stay (HOSPITAL_COMMUNITY): Payer: Medicaid Other | Attending: Obstetrics and Gynecology

## 2022-03-27 ENCOUNTER — Inpatient Hospital Stay (HOSPITAL_COMMUNITY)
Admission: RE | Admit: 2022-03-27 | Payer: Medicaid Other | Source: Home / Self Care | Admitting: Obstetrics and Gynecology

## 2022-03-27 ENCOUNTER — Inpatient Hospital Stay (EMERGENCY_DEPARTMENT_HOSPITAL)
Admission: AD | Admit: 2022-03-27 | Discharge: 2022-03-28 | Disposition: A | Discharge status: Home / Self Care | Payer: Medicaid Other | Source: Home / Self Care | Attending: Obstetrics and Gynecology | Admitting: Obstetrics and Gynecology

## 2022-03-27 ENCOUNTER — Encounter (HOSPITAL_COMMUNITY): Payer: Self-pay | Admitting: Obstetrics and Gynecology

## 2022-03-27 DIAGNOSIS — O471 False labor at or after 37 completed weeks of gestation: Secondary | ICD-10-CM | POA: Insufficient documentation

## 2022-03-27 DIAGNOSIS — Z3A39 39 weeks gestation of pregnancy: Secondary | ICD-10-CM | POA: Insufficient documentation

## 2022-03-27 DIAGNOSIS — O479 False labor, unspecified: Secondary | ICD-10-CM

## 2022-03-27 DIAGNOSIS — N898 Other specified noninflammatory disorders of vagina: Secondary | ICD-10-CM

## 2022-03-27 DIAGNOSIS — O99334 Smoking (tobacco) complicating childbirth: Secondary | ICD-10-CM | POA: Insufficient documentation

## 2022-03-27 NOTE — MAU Note (Signed)
..  Debra Stafford is a 26 y.o. at [redacted]w[redacted]d here in MAU reporting: reports contractions for 4 hours that are now every 2 minutes. Denies vaginal bleeding or leaking of fluid. +FM but has not kept track of movement since contractions began.   Pain score: 10/10 Vitals:   03/27/22 2137  BP: 106/75  Pulse: 94  Resp: 18  Temp: 97.9 F (36.6 C)  SpO2: 100%     FHT:140 Lab orders placed from triage:  none

## 2022-03-27 NOTE — H&P (Deleted)
  The note originally documented on this encounter has been moved the the encounter in which it belongs.  

## 2022-03-27 NOTE — H&P (Signed)
Debra Stafford is a 26 y.o.G3P0020 female presenting for elective IOL at 34 1/[redacted]wks gestation. Pt is dated per LMP which was confirmed with  a 10 eek [place]. Her pregnancy has been complicated by anemia of pregnancy *8.6 at 28 weeks) . She received IV iron x 2 - improved to 9.2  Noted isolated EIF on [place].  GBS neg. First trimester screen and NT wnl . OB History     Gravida  3   Para  0   Term  0   Preterm  0   AB  2   Living  0      SAB  2   IAB  0   Ectopic  0   Multiple  0   Live Births  0          Past Medical History:  Diagnosis Date   Anemia    Anxiety    Headache    UTI (urinary tract infection)    Vasovagal episode    Past Surgical History:  Procedure Laterality Date   NO PAST SURGERIES     Family History: family history includes Depression in her mother; Healthy in her father. Social History:  reports that she has never smoked. She has never used smokeless tobacco. She reports that she does not currently use alcohol. She reports that she does not use drugs.     Maternal Diabetes: No Genetic Screening: Normal Maternal Ultrasounds/Referrals: Normal Fetal Ultrasounds or other Referrals:  None Maternal Substance Abuse:  No Significant Maternal Medications:  None Significant Maternal Lab Results:  Group B Strep negative Number of Prenatal Visits:greater than 3 verified prenatal visits Other Comments:  None  Review of Systems  Constitutional:  Positive for activity change and fatigue.  Eyes:  Negative for photophobia.  Respiratory:  Negative for chest tightness and shortness of breath.   Cardiovascular:  Positive for leg swelling. Negative for chest pain and palpitations.  Gastrointestinal:  Positive for abdominal pain. Negative for diarrhea, nausea and rectal pain.  Genitourinary:  Negative for vaginal bleeding.  Musculoskeletal:  Negative for back pain and myalgias.  Neurological:  Negative for light-headedness.  Psychiatric/Behavioral:  The patient is  nervous/anxious.    Maternal Medical History:  Reason for admission: Nausea. Elective iol  Fetal activity: Perceived fetal activity is normal.   Prenatal complications: no prenatal complications Prenatal Complications - Diabetes: none.     Last menstrual period 04/22/2021. Maternal Exam:  Uterine Assessment: Contraction frequency is irregular.  Abdomen: Patient reports generalized tenderness.  Estimated fetal weight is AGA.   Fetal presentation: vertex Introitus: Normal vulva. Vulva is negative for condylomata and lesion.  Normal vagina.  Vagina is negative for condylomata.  Pelvis: adequate for delivery.   Cervix: Cervix evaluated by digital exam.     Fetal Exam Fetal Monitor Review: Baseline rate: 142.    Physical Exam Constitutional:      Appearance: Normal appearance. She is normal weight.  Pulmonary:     Effort: Pulmonary effort is normal.  Abdominal:     Tenderness: There is generalized abdominal tenderness.  Genitourinary:    General: Normal vulva.  Vulva is no lesion.  Musculoskeletal:        General: Normal range of motion.     Cervical back: Normal range of motion.  Skin:    General: Skin is warm and dry.     Capillary Refill: Capillary refill takes 2 to 3 seconds.  Neurological:     General: No focal deficit present.  Mental Status: She is alert and oriented to person, place, and time. Mental status is at baseline.  Psychiatric:        Mood and Affect: Mood normal.        Behavior: Behavior normal.        Thought Content: Thought content normal.        Judgment: Judgment normal.     Prenatal labs: ABO, Rh: --/--/O POS Performed at [facility], [address] [place], [place] [address]  [phone]/12 1830) Antibody: NEG (09/12 1736) Rubella: Immune, Immune (04/19 0000) RPR: Nonreactive (04/19 0000)  HBsAg: Negative, Negative (04/19 0000)  HIV: Non-reactive, Non-reactive (04/19 0000)  GBS: Negative/-- (10/17 0000)   Assessment/Plan: 35IR  [id number] female at 75 1/[redacted]wks gestation for elective IOL  - Admit  - IOL with cytotec then pit/AROM to follow - GBS neg - Pain control prn  - Anticipate svd   Janean Sark Ameer Sanden 03/27/2022, 3:25 PM

## 2022-03-28 ENCOUNTER — Inpatient Hospital Stay (HOSPITAL_COMMUNITY)
Admission: AD | Admit: 2022-03-28 | Discharge: 2022-03-30 | DRG: 807 | Disposition: A | Discharge status: Home / Self Care | Payer: Medicaid Other | Attending: Obstetrics and Gynecology | Admitting: Obstetrics and Gynecology

## 2022-03-28 ENCOUNTER — Inpatient Hospital Stay (HOSPITAL_COMMUNITY): Payer: Medicaid Other | Admitting: Anesthesiology

## 2022-03-28 ENCOUNTER — Encounter (HOSPITAL_COMMUNITY): Payer: Self-pay | Admitting: Obstetrics and Gynecology

## 2022-03-28 ENCOUNTER — Other Ambulatory Visit: Payer: Self-pay

## 2022-03-28 DIAGNOSIS — O479 False labor, unspecified: Secondary | ICD-10-CM | POA: Diagnosis not present

## 2022-03-28 DIAGNOSIS — O26893 Other specified pregnancy related conditions, third trimester: Secondary | ICD-10-CM

## 2022-03-28 DIAGNOSIS — O4292 Full-term premature rupture of membranes, unspecified as to length of time between rupture and onset of labor: Principal | ICD-10-CM | POA: Diagnosis present

## 2022-03-28 DIAGNOSIS — O9902 Anemia complicating childbirth: Secondary | ICD-10-CM | POA: Diagnosis present

## 2022-03-28 DIAGNOSIS — Z3A39 39 weeks gestation of pregnancy: Secondary | ICD-10-CM | POA: Diagnosis not present

## 2022-03-28 DIAGNOSIS — N898 Other specified noninflammatory disorders of vagina: Secondary | ICD-10-CM | POA: Diagnosis not present

## 2022-03-28 DIAGNOSIS — Z3490 Encounter for supervision of normal pregnancy, unspecified, unspecified trimester: Secondary | ICD-10-CM

## 2022-03-28 LAB — TYPE AND SCREEN
ABO/RH(D): O POS
Antibody Screen: NEGATIVE

## 2022-03-28 LAB — CBC
HCT: 32.6 % — ABNORMAL LOW (ref 36.0–46.0)
Hemoglobin: 10.5 g/dL — ABNORMAL LOW (ref 12.0–15.0)
MCH: 26.4 pg (ref 26.0–34.0)
MCHC: 32.2 g/dL (ref 30.0–36.0)
MCV: 82.1 fL (ref 80.0–100.0)
Platelets: 261 10*3/uL (ref 150–400)
RBC: 3.97 MIL/uL (ref 3.87–5.11)
RDW: 16.2 % — ABNORMAL HIGH (ref 11.5–15.5)
WBC: 15.9 10*3/uL — ABNORMAL HIGH (ref 4.0–10.5)
nRBC: 0 % (ref 0.0–0.2)

## 2022-03-28 LAB — POCT FERN TEST
POCT Fern Test: NEGATIVE
POCT Fern Test: POSITIVE

## 2022-03-28 MED ORDER — LACTATED RINGERS IV SOLN: INTRAVENOUS | Status: DC

## 2022-03-28 MED ORDER — LIDOCAINE HCL (PF) 1 % IJ SOLN
INTRAMUSCULAR | Status: DC | PRN
  Administered 2022-03-28: 5 mL via EPIDURAL

## 2022-03-28 MED ORDER — LIDOCAINE HCL (PF) 1 % IJ SOLN: 30.0000 mL | INTRAMUSCULAR | Status: DC | PRN

## 2022-03-28 MED ORDER — PHENYLEPHRINE 80 MCG/ML (10ML) SYRINGE FOR IV PUSH (FOR BLOOD PRESSURE SUPPORT): 80.0000 ug | PREFILLED_SYRINGE | INTRAVENOUS | Status: DC | PRN

## 2022-03-28 MED ORDER — ONDANSETRON HCL 4 MG/2ML IJ SOLN
4.0000 mg | Freq: Four times a day (QID) | INTRAMUSCULAR | Status: DC | PRN
  Administered 2022-03-28: 4 mg via INTRAVENOUS
  Filled 2022-03-28: qty 2

## 2022-03-28 MED ORDER — TERBUTALINE SULFATE 1 MG/ML IJ SOLN: 0.2500 mg | Freq: Once | INTRAMUSCULAR | Status: DC | PRN

## 2022-03-28 MED ORDER — EPHEDRINE 5 MG/ML INJ: 10.0000 mg | INTRAVENOUS | Status: DC | PRN

## 2022-03-28 MED ORDER — LACTATED RINGERS IV SOLN
500.0000 mL | Freq: Once | INTRAVENOUS | Status: AC
  Administered 2022-03-28: 500 mL via INTRAVENOUS

## 2022-03-28 MED ORDER — FENTANYL-BUPIVACAINE-NACL 0.5-0.125-0.9 MG/250ML-% EP SOLN
EPIDURAL | Status: DC | PRN
  Administered 2022-03-28: 12 mL/h via EPIDURAL

## 2022-03-28 MED ORDER — DIPHENHYDRAMINE HCL 50 MG/ML IJ SOLN: 12.5000 mg | INTRAMUSCULAR | Status: DC | PRN

## 2022-03-28 MED ORDER — OXYTOCIN-SODIUM CHLORIDE 30-0.9 UT/500ML-% IV SOLN: 1.0000 m[IU]/min | INTRAVENOUS | Status: DC

## 2022-03-28 MED ORDER — OXYTOCIN BOLUS FROM INFUSION
333.0000 mL | Freq: Once | INTRAVENOUS | Status: AC
  Administered 2022-03-29: 333 mL via INTRAVENOUS

## 2022-03-28 MED ORDER — OXYTOCIN-SODIUM CHLORIDE 30-0.9 UT/500ML-% IV SOLN
2.5000 [IU]/h | INTRAVENOUS | Status: DC
  Administered 2022-03-29: 2.5 [IU]/h via INTRAVENOUS
  Filled 2022-03-28: qty 500

## 2022-03-28 MED ORDER — LACTATED RINGERS IV SOLN: 500.0000 mL | INTRAVENOUS | Status: DC | PRN

## 2022-03-28 MED ORDER — OXYCODONE-ACETAMINOPHEN 5-325 MG PO TABS: 1.0000 | ORAL_TABLET | ORAL | Status: DC | PRN

## 2022-03-28 MED ORDER — SOD CITRATE-CITRIC ACID 500-334 MG/5ML PO SOLN
30.0000 mL | ORAL | Status: DC | PRN
  Administered 2022-03-28: 30 mL via ORAL
  Filled 2022-03-28: qty 30

## 2022-03-28 MED ORDER — MISOPROSTOL 50MCG HALF TABLET: 50.0000 ug | ORAL_TABLET | Freq: Once | ORAL | Status: DC

## 2022-03-28 MED ORDER — MISOPROSTOL 25 MCG QUARTER TABLET
25.0000 ug | ORAL_TABLET | Freq: Once | ORAL | Status: DC
  Filled 2022-03-28: qty 1

## 2022-03-28 MED ORDER — ACETAMINOPHEN 325 MG PO TABS: 650.0000 mg | ORAL_TABLET | ORAL | Status: DC | PRN

## 2022-03-28 MED ORDER — FENTANYL CITRATE (PF) 100 MCG/2ML IJ SOLN
50.0000 ug | INTRAMUSCULAR | Status: DC | PRN
  Administered 2022-03-28: 100 ug via INTRAVENOUS
  Filled 2022-03-28: qty 2

## 2022-03-28 MED ORDER — FENTANYL-BUPIVACAINE-NACL 0.5-0.125-0.9 MG/250ML-% EP SOLN
12.0000 mL/h | EPIDURAL | Status: DC | PRN
  Filled 2022-03-28: qty 250

## 2022-03-28 MED ORDER — OXYCODONE-ACETAMINOPHEN 5-325 MG PO TABS: 2.0000 | ORAL_TABLET | ORAL | Status: DC | PRN

## 2022-03-28 NOTE — MAU Note (Signed)
Debra Stafford is a 26 y.o. at [redacted]w[redacted]d here in MAU reporting: felt a pop around 1600 and since then has been leaking clear fluid. Contractions every minute. Good FM today. Some bleeding.   Onset of complaint: today  Pain score: 10/10  Vitals:   03/28/22 1653  BP: 124/81  Pulse: 96  Resp: 16  Temp: 97.9 F (36.6 C)  SpO2: 100%     FHT: EFM applied in room  Lab orders placed from triage: none

## 2022-03-28 NOTE — Anesthesia Procedure Notes (Signed)
Epidural Patient location during procedure: OB Start time: 03/28/2022 6:38 PM End time: 03/28/2022 6:53 PM  Staffing Anesthesiologist: Trevor Iha, MD Performed: anesthesiologist   Preanesthetic Checklist Completed: patient identified, IV checked, site marked, risks and benefits discussed, surgical consent, monitors and equipment checked, pre-op evaluation and timeout performed  Epidural Patient position: sitting Prep: DuraPrep and site prepped and draped Patient monitoring: continuous pulse ox and blood pressure Approach: midline Location: L3-L4 Injection technique: LOR air  Needle:  Needle type: Tuohy  Needle gauge: 17 G Needle length: 9 cm and 9 Needle insertion depth: 6 cm Catheter type: closed end flexible Catheter size: 19 Gauge Catheter at skin depth: 12 cm Test dose: negative  Assessment Events: blood not aspirated, injection not painful, no injection resistance, no paresthesia and negative IV test  Additional Notes Patient identified. Risks/Benefits/Options discussed with patient including but not limited to bleeding, infection, nerve damage, paralysis, failed block, incomplete pain control, headache, blood pressure changes, nausea, vomiting, reactions to medication both or allergic, itching and postpartum back pain. Confirmed with bedside nurse the patient's most recent platelet count. Confirmed with patient that they are not currently taking any anticoagulation, have any bleeding history or any family history of bleeding disorders. Patient expressed understanding and wished to proceed. All questions were answered. Sterile technique was used throughout the entire procedure. Please see nursing notes for vital signs. Test dose was given through epidural needle and negative prior to continuing to dose epidural or start infusion. Warning signs of high block given to the patient including shortness of breath, tingling/numbness in hands, complete motor block, or any  concerning symptoms with instructions to call for help. Patient was given instructions on fall risk and not to get out of bed. All questions and concerns addressed with instructions to call with any issues.  1                              Attempt (S) . Patient tolerated procedure well.

## 2022-03-28 NOTE — MAU Provider Note (Signed)
Chief Complaint:  Contractions   None    HPI: Debra Stafford is a 26 y.o. G3P0020 at 55w3dwho presents to maternity admissions reporting painful contractions.  Reports a trickle of fluid when she went to bathroom. . She reports good fetal movement, denies LOF, vaginal bleeding, vaginal itching/burning, urinary symptoms, h/a, dizziness, n/v, diarrhea, constipation or fever/chills.  She denies headache, visual changes or RUQ abdominal pain.  Vaginal Discharge The patient's primary symptoms include pelvic pain and vaginal discharge. The patient's pertinent negatives include no genital itching, genital odor or vaginal bleeding. This is a new problem. The current episode started today. The pain is severe. She is pregnant. Associated symptoms include abdominal pain and back pain. Pertinent negatives include no diarrhea, dysuria, fever or headaches. The vaginal discharge was clear and watery. There has been no bleeding. She has not been passing clots. She has not been passing tissue. Nothing aggravates the symptoms. She has tried nothing for the symptoms.   RN Note: Debra Stafford is a 26 y.o. at [redacted]w[redacted]d here in MAU reporting: reports contractions for 4 hours that are now every 2 minutes. Denies vaginal bleeding or leaking of fluid. +FM but has not kept track of movement since contractions began.     Past Medical History: Past Medical History:  Diagnosis Date   Anemia    Anxiety    Headache    UTI (urinary tract infection)    Vasovagal episode     Past obstetric history: OB History  Gravida Para Term Preterm AB Living  3 0 0 0 2 0  SAB IAB Ectopic Multiple Live Births  2 0 0 0 0    # Outcome Date GA Lbr Len/2nd Weight Sex Delivery Anes PTL Lv  3 Current           2 SAB 2022          1 SAB 2018            Past Surgical History: Past Surgical History:  Procedure Laterality Date   NO PAST SURGERIES      Family History: Family History  Problem Relation Age of Onset   Depression Mother     Healthy Father     Social History: Social History   Tobacco Use   Smoking status: Never   Smokeless tobacco: Never  Vaping Use   Vaping Use: Never used  Substance Use Topics   Alcohol use: Not Currently    Comment: occ   Drug use: No    Allergies:  Allergies  Allergen Reactions   Codeine Anaphylaxis    Hives, fever, throat closes   Sulfamethoxazole-Trimethoprim Hives and Rash    Fever 105F, bleeding     Meds:  Medications Prior to Admission  Medication Sig Dispense Refill Last Dose   metroNIDAZOLE (METROGEL) 1 % gel Apply topically daily.   03/26/2022   Prenatal Vit-Fe Fumarate-FA (PRENATAL MULTIVITAMIN) TABS tablet Take 1 tablet by mouth daily at 12 noon.   Past Week at metronidazol   metoCLOPramide (REGLAN) 10 MG tablet Take 1 tablet (10 mg total) by mouth every 6 (six) hours. 30 tablet 0 Unknown   promethazine (PHENERGAN) 25 MG suppository Place 1 suppository (25 mg total) rectally every 6 (six) hours as needed for nausea or vomiting. 12 each 0 More than a month    I have reviewed patient's Past Medical Hx, Surgical Hx, Family Hx, Social Hx, medications and allergies.   ROS:  Review of Systems  Constitutional:  Negative for fever.  Gastrointestinal:  Positive for abdominal pain. Negative for diarrhea.  Genitourinary:  Positive for pelvic pain and vaginal discharge. Negative for dysuria.  Musculoskeletal:  Positive for back pain.  Neurological:  Negative for headaches.   Other systems negative  Physical Exam  Patient Vitals for the past 24 hrs:  BP Temp Temp src Pulse Resp SpO2 Height Weight  03/27/22 2205 110/65 -- -- 100 -- 98 % -- --  03/27/22 2137 106/75 97.9 F (36.6 C) Oral 94 18 100 % 5\' 6"  (1.676 m) 73.7 kg   Constitutional: Well-developed, well-nourished female in no acute distress.  Cardiovascular: normal rate  Respiratory: normal effort, GI: Abd soft, non-tender, gravid appropriate for gestational age.   No rebound or guarding. MS: Extremities  nontender, no edema, normal ROM Neurologic: Alert and oriented x 4.  GU: Neg CVAT.  PELVIC EXAM: Cervix pink, visually closed, without lesion, scant white creamy discharge, vaginal walls and external genitalia normal No pooling, no ferning  Dilation: 2 Effacement (%): 60 Cervical Position: Posterior Station: -3 Presentation: Vertex Exam by:: Hansel Feinstein, CNM  FHT:  Baseline 140 , moderate variability, accelerations present, no decelerations Contractions: q 2-4 mins Irregular     Labs: Results for orders placed or performed during the hospital encounter of 03/27/22 (from the past 24 hour(s))  Fern Test     Status: Normal   Collection Time: 03/28/22 12:55 AM  Result Value Ref Range   POCT Fern Test Negative = intact amniotic membranes     --/--/O POS Performed at [facility], [place] [address] [place], [place] [address]  [phone] [phone])  Imaging:  No results found.  MAU Course/MDM: I have reviewed the triage vital signs and the nursing notes.   Pertinent labs & imaging results that were available during my care of the patient were reviewed by me and considered in my medical decision making (see chart for details).      I have reviewed her medical records including past results, notes and treatments.   No evidence of ruptured membranes  NST reviewed, reassuring.  Treatments in MAU included EFM, SSE.    Assessment: Single IUP at [redacted]w[redacted]d Latent vs Prodromal Labor No evidence of ruptured membranes  Plan: Discharge home Labor precautions and fetal kick counts Follow up in Labor when called for IOL.   Encouraged to return if she develops worsening of symptoms, increase in pain, fever, or other concerning symptoms.   Pt stable at time of discharge.  Hansel Feinstein CNM, MSN Certified Nurse-Midwife 03/28/2022 12:52 AM

## 2022-03-28 NOTE — Progress Notes (Signed)
26yo G59P0020 female at 66 1/[redacted]wks gestation for elective IOL which was delayed for unavailability of beds.  Today she comes in grossly ruptured at 1600 with contractions.  No bleeding and good fm.    Vitals:   03/28/22 1653  BP: 124/81  Pulse: 96  Resp: 16  Temp: 97.9 F (36.6 C)  TempSrc: Oral  SpO2: 100%    Results for orders placed or performed during the hospital encounter of 03/27/22 (from the past 72 hour(s))  Fern Test     Status: Normal   Collection Time: 03/28/22 12:55 AM  Result Value Ref Range   POCT Fern Test Negative = intact amniotic membranes    FHTs 140s, gSTV, NST R Toco q1 SVE per nurses 3/70/-3, grossly ruptured  Term SROM with early labor.  Pitocin for augmentation as necessary.  GBS neg.   Loney Laurence

## 2022-03-28 NOTE — Anesthesia Preprocedure Evaluation (Addendum)
Anesthesia Evaluation  Patient identified by MRN, date of birth, ID band Patient awake    Reviewed: Allergy & Precautions, NPO status , Patient's Chart, lab work & pertinent test results  Airway Mallampati: II  TM Distance: >3 FB Neck ROM: Full    Dental no notable dental hx. (+) Teeth Intact, Dental Advisory Given   Pulmonary neg pulmonary ROS   Pulmonary exam normal breath sounds clear to auscultation       Cardiovascular Exercise Tolerance: Good Normal cardiovascular exam Rhythm:Regular Rate:Normal     Neuro/Psych   Anxiety     negative neurological ROS     GI/Hepatic Neg liver ROS,,,  Endo/Other  negative endocrine ROS    Renal/GU negative Renal ROS     Musculoskeletal   Abdominal   Peds  Hematology Lab Results      Component                Value               Date                      WBC                      15.9 (H)            03/28/2022                HGB                      10.5 (L)            03/28/2022                HCT                      32.6 (L)            03/28/2022                MCV                      82.1                03/28/2022                PLT                      261                 03/28/2022              Anesthesia Other Findings   Reproductive/Obstetrics (+) Pregnancy                             Anesthesia Physical Anesthesia Plan  ASA: 2  Anesthesia Plan: Epidural   Post-op Pain Management:    Induction:   PONV Risk Score and Plan:   Airway Management Planned:   Additional Equipment:   Intra-op Plan:   Post-operative Plan:   Informed Consent: I have reviewed the patients History and Physical, chart, labs and discussed the procedure including the risks, benefits and alternatives for the proposed anesthesia with the patient or authorized representative who has indicated his/her understanding and acceptance.       Plan Discussed with:    Anesthesia Plan Comments: (39.3  wk  G3P0 for  LEA)        Anesthesia Quick Evaluation

## 2022-03-29 ENCOUNTER — Encounter (HOSPITAL_COMMUNITY): Payer: Self-pay | Admitting: Obstetrics and Gynecology

## 2022-03-29 LAB — CBC
HCT: 24.9 % — ABNORMAL LOW (ref 36.0–46.0)
Hemoglobin: 8 g/dL — ABNORMAL LOW (ref 12.0–15.0)
MCH: 26.7 pg (ref 26.0–34.0)
MCHC: 32.1 g/dL (ref 30.0–36.0)
MCV: 83 fL (ref 80.0–100.0)
Platelets: 214 10*3/uL (ref 150–400)
RBC: 3 MIL/uL — ABNORMAL LOW (ref 3.87–5.11)
RDW: 16.7 % — ABNORMAL HIGH (ref 11.5–15.5)
WBC: 22.4 10*3/uL — ABNORMAL HIGH (ref 4.0–10.5)
nRBC: 0 % (ref 0.0–0.2)

## 2022-03-29 LAB — RPR: RPR Ser Ql: NONREACTIVE

## 2022-03-29 MED ORDER — SIMETHICONE 80 MG PO CHEW: 80.0000 mg | CHEWABLE_TABLET | ORAL | Status: DC | PRN

## 2022-03-29 MED ORDER — SODIUM CHLORIDE 0.9 % IV SOLN: 250.0000 mL | INTRAVENOUS | Status: DC | PRN

## 2022-03-29 MED ORDER — METHYLERGONOVINE MALEATE 0.2 MG PO TABS: 0.2000 mg | ORAL_TABLET | ORAL | Status: DC | PRN

## 2022-03-29 MED ORDER — PRENATAL MULTIVITAMIN CH
1.0000 | ORAL_TABLET | Freq: Every day | ORAL | Status: DC
  Administered 2022-03-29 – 2022-03-30 (×2): 1 via ORAL
  Filled 2022-03-29 (×2): qty 1

## 2022-03-29 MED ORDER — ONDANSETRON HCL 4 MG/2ML IJ SOLN: 4.0000 mg | INTRAMUSCULAR | Status: DC | PRN

## 2022-03-29 MED ORDER — DIPHENHYDRAMINE HCL 25 MG PO CAPS: 25.0000 mg | ORAL_CAPSULE | Freq: Four times a day (QID) | ORAL | Status: DC | PRN

## 2022-03-29 MED ORDER — METHYLERGONOVINE MALEATE 0.2 MG/ML IJ SOLN: 0.2000 mg | INTRAMUSCULAR | Status: DC | PRN

## 2022-03-29 MED ORDER — BENZOCAINE-MENTHOL 20-0.5 % EX AERO: 1.0000 | INHALATION_SPRAY | CUTANEOUS | Status: DC | PRN

## 2022-03-29 MED ORDER — TETANUS-DIPHTH-ACELL PERTUSSIS 5-2.5-18.5 LF-MCG/0.5 IM SUSY: 0.5000 mL | PREFILLED_SYRINGE | Freq: Once | INTRAMUSCULAR | Status: DC

## 2022-03-29 MED ORDER — MAGNESIUM HYDROXIDE 400 MG/5ML PO SUSP: 30.0000 mL | ORAL | Status: DC | PRN

## 2022-03-29 MED ORDER — SODIUM CHLORIDE 0.9% FLUSH: 3.0000 mL | INTRAVENOUS | Status: DC | PRN

## 2022-03-29 MED ORDER — SENNOSIDES-DOCUSATE SODIUM 8.6-50 MG PO TABS
2.0000 | ORAL_TABLET | Freq: Every day | ORAL | Status: DC
  Administered 2022-03-30: 2 via ORAL
  Filled 2022-03-29: qty 2

## 2022-03-29 MED ORDER — ONDANSETRON HCL 4 MG PO TABS: 4.0000 mg | ORAL_TABLET | ORAL | Status: DC | PRN

## 2022-03-29 MED ORDER — SODIUM CHLORIDE 0.9% FLUSH
3.0000 mL | Freq: Two times a day (BID) | INTRAVENOUS | Status: DC
  Administered 2022-03-29: 3 mL via INTRAVENOUS

## 2022-03-29 MED ORDER — COCONUT OIL OIL: 1.0000 | TOPICAL_OIL | Status: DC | PRN

## 2022-03-29 MED ORDER — ACETAMINOPHEN 325 MG PO TABS
650.0000 mg | ORAL_TABLET | ORAL | Status: DC | PRN
  Administered 2022-03-29 – 2022-03-30 (×2): 650 mg via ORAL
  Filled 2022-03-29 (×2): qty 2

## 2022-03-29 MED ORDER — OXYCODONE HCL 5 MG PO TABS
5.0000 mg | ORAL_TABLET | ORAL | Status: DC | PRN
  Administered 2022-03-29 (×2): 5 mg via ORAL
  Filled 2022-03-29 (×2): qty 1

## 2022-03-29 MED ORDER — FERROUS SULFATE 325 (65 FE) MG PO TABS
325.0000 mg | ORAL_TABLET | Freq: Two times a day (BID) | ORAL | Status: DC
  Administered 2022-03-29 (×2): 325 mg via ORAL
  Filled 2022-03-29 (×2): qty 1

## 2022-03-29 MED ORDER — MEASLES, MUMPS & RUBELLA VAC IJ SOLR: 0.5000 mL | Freq: Once | INTRAMUSCULAR | Status: DC

## 2022-03-29 MED ORDER — OXYCODONE-ACETAMINOPHEN 5-325 MG PO TABS
2.0000 | ORAL_TABLET | ORAL | Status: DC | PRN
  Filled 2022-03-29: qty 2

## 2022-03-29 MED ORDER — DIBUCAINE (PERIANAL) 1 % EX OINT: 1.0000 | TOPICAL_OINTMENT | CUTANEOUS | Status: DC | PRN

## 2022-03-29 MED ORDER — POLYSACCHARIDE IRON COMPLEX 150 MG PO CAPS: 150.0000 mg | ORAL_CAPSULE | Freq: Every day | ORAL | Status: DC

## 2022-03-29 MED ORDER — POLYSACCHARIDE IRON COMPLEX 150 MG PO CAPS
150.0000 mg | ORAL_CAPSULE | Freq: Every day | ORAL | Status: DC
  Administered 2022-03-30: 150 mg via ORAL
  Filled 2022-03-29: qty 1

## 2022-03-29 MED ORDER — WITCH HAZEL-GLYCERIN EX PADS: 1.0000 | MEDICATED_PAD | CUTANEOUS | Status: DC | PRN

## 2022-03-29 MED ORDER — IBUPROFEN 800 MG PO TABS
800.0000 mg | ORAL_TABLET | Freq: Three times a day (TID) | ORAL | Status: DC
  Administered 2022-03-29 – 2022-03-30 (×5): 800 mg via ORAL
  Filled 2022-03-29 (×5): qty 1

## 2022-03-29 MED ORDER — ZOLPIDEM TARTRATE 5 MG PO TABS: 5.0000 mg | ORAL_TABLET | Freq: Every evening | ORAL | Status: DC | PRN

## 2022-03-29 NOTE — Anesthesia Postprocedure Evaluation (Signed)
Anesthesia Post Note  Patient: Debra Stafford  Procedure(s) Performed: AN AD HOC LABOR EPIDURAL     Patient location during evaluation: Mother Baby Anesthesia Type: Epidural Level of consciousness: awake, oriented and awake and alert Pain management: pain level controlled Vital Signs Assessment: post-procedure vital signs reviewed and stable Respiratory status: spontaneous breathing, respiratory function stable and nonlabored ventilation Cardiovascular status: stable Postop Assessment: no headache, adequate PO intake, able to ambulate, patient able to bend at knees and no apparent nausea or vomiting Anesthetic complications: no   No notable events documented.  Last Vitals:  Vitals:   03/29/22 0246 03/29/22 0420  BP: 102/63 110/70  Pulse: (!) 102 100  Resp: 16 16  Temp:  37 C  SpO2:      Last Pain:  Vitals:   03/29/22 0420  TempSrc: Oral  PainSc: 0-No pain   Pain Goal:                   Chayim Bialas

## 2022-03-29 NOTE — Lactation Note (Signed)
This note was copied from a baby's chart. Lactation Consultation Note  Patient Name: Debra Stafford [id number] Date: 03/29/2022 Reason for consult: L&D Initial assessment Age:24 hours Birth Parent latched infant on her right breast using the football hold position, infant latched with depth, and BF for 15 minutes. Birth Parent knows to ask for further latch assistance from RN/LC on MBU if needed. Birth Parent will continue to BF infant according to hunger cues, on demand, 8 to 12+ times within 24 hours, STS. Birth Parent could benefit from hand pump on MBU to pre-pump breast prior to latching infant. Maternal Data    Feeding Mother's Current Feeding Choice: Breast Milk  LATCH Score Latch: Grasps breast easily, tongue down, lips flanged, rhythmical sucking.  Audible Swallowing: A few with stimulation  Type of Nipple: Flat  Comfort (Breast/Nipple): Soft / non-tender  Hold (Positioning): Assistance needed to correctly position infant at breast and maintain latch.  LATCH Score: 7   Lactation Tools Discussed/Used    Interventions Interventions: Skin to skin;Position options;Support pillows;Assisted with latch;Adjust position;Breast compression  Discharge    Consult Status Consult Status: Follow-up from L&D    Frederico Hamman 03/29/2022, 1:57 AM

## 2022-03-29 NOTE — Progress Notes (Signed)
POSTPARTUM PROGRESS NOTE  Post Partum Day #0  Subjective:  No acute events overnight.  Pt denies problems with ambulating, voiding or po intake.  She denies nausea or vomiting.  Pain is well controlled. Still with occ back pain from epidural.  Lochia Minimal.   Objective: Blood pressure 100/64, pulse 79, temperature 97.6 F (36.4 C), temperature source Oral, resp. rate 17, last menstrual period 04/22/2021, SpO2 99 %, unknown if currently breastfeeding.  Physical Exam:  General: alert, cooperative and no distress Lochia:normal flow Chest: CTAB Heart: RRR no m/r/g Abdomen: +BS, soft, nontender Uterine Fundus: firm, 2cm below umbilicus GU: suture intact, healing well, no purulent drainage Extremities: neg edema, neg calf TTP BL, neg Homans BL  Recent Labs    03/28/22 1716 03/29/22 0507  HGB 10.5* 8.0*  HCT 32.6* 24.9*    Assessment/Plan:  ASSESSMENT: Debra Stafford is a 26 y.o. F1T0211 s/p SVD @ [redacted]w[redacted]d. PNC c/b anemia, isolated EIF.   Plan for discharge tomorrow, Breastfeeding, and Circumcision prior to discharge PO niferex    LOS: 1 day

## 2022-03-29 NOTE — Lactation Note (Signed)
This note was copied from a baby's chart. Lactation Consultation Note  Patient Name: Debra Stafford [id number] Date: 03/29/2022 Reason for consult: Initial assessment;Term Age:24 hours P1, term female infant. Infant had:  2 stools and one void since birth. Per Birth Parent, she has made three attempts to latch infant and she is supplementing infant with formula. Birth Parent is continue to work on latching infant at the breast, Birth Parent  pre-pump breast with hand pump,  prior to latching infant at the breast,  Birth Parent attempted  to latch infant on her left breast using the cross cradle hold, infant only held nipple in mouth did not  elicit sucking at this time. Infant was pace feed 10 mls of 20 kcal formula with purple and clear slow flow bottle nipple. Infant had a small emesis that was clear and mucous. Mom made aware of O/P services, breastfeeding support groups, community resources, and our phone # for post-discharge questions.   Birth Parent feeding plan: 1- Birth Plan will continue to pre-pump breast prior to latching infant, continue to BF infant according to hunger cues, on demand, 8 to 12+ times within 24 hours, STS. 2- Birth Parent will continue to latch infant 1st for every feeding and if infant doesn't latch, she plans to offer infant formula ( this is her choice). 3- Birth Parent will continue to call RN/LC for further latch assistance.  Maternal Data Has patient been taught Hand Expression?: Yes Does the patient have breastfeeding experience prior to this delivery?: No  Feeding Mother's Current Feeding Choice: Breast Milk and Formula Nipple Type: Extra Slow Flow  LATCH Score Latch: Too sleepy or reluctant, no latch achieved, no sucking elicited.  Audible Swallowing: None  Type of Nipple: Flat (Birth Parent been given hand pump to pre-pump breast prior to latching infant to help evert nipple shaft put more.)  Comfort (Breast/Nipple): Soft / non-tender  Hold  (Positioning): Assistance needed to correctly position infant at breast and maintain latch.  LATCH Score: 4   Lactation Tools Discussed/Used Tools: Pump Breast pump type: Manual Pump Education: Setup, frequency, and cleaning;Milk Storage Reason for Pumping: pre-pump prior to latching infant at the breast. Pumping frequency: pre-pump prior to latching infant at the breast.  Interventions Interventions: Breast feeding basics reviewed;Assisted with latch;Skin to skin;Position options;Support pillows;Adjust position;Breast compression  Discharge Pump: Personal (Per Birth Parent she has DEBP at home through her insurance but she can not remember the brand name.)  Consult Status Consult Status: Follow-up Date: 03/30/22 Follow-up type: In-patient    Frederico Hamman 03/29/2022, 5:22 PM

## 2022-03-30 NOTE — Progress Notes (Signed)
Patient is doing well.  She is ambulating, voiding, tolerating PO.  Pain control is good.  Lochia is appropriate  Vitals:   03/29/22 0810 03/29/22 1545 03/29/22 2019 03/30/22 0541  BP: 99/64 92/60 100/64 (!) 90/54  Pulse: 89 82 79 77  Resp: 17 18 17 17   Temp: 98 F (36.7 C) 98.1 F (36.7 C) 97.6 F (36.4 C) 98 F (36.7 C)  TempSrc: Oral Oral Oral Oral  SpO2:    99%    NAD Fundus firm Ext: trace edema bilaterally  Lab Results  Component Value Date   WBC 22.4 (H) 03/29/2022   HGB 8.0 (L) 03/29/2022   HCT 24.9 (L) 03/29/2022   MCV 83.0 03/29/2022   PLT 214 03/29/2022    --/--/O POS (11/09 1715)  A/P 26 y.o. 09-20-1983 PPD#1 s/p VAVD at term (11/10 at 0100) Routine care.   Anemia of pregnancy at baseline--plan PO iron at discharge  May desires discharge home today pending how baby feeds after circumcision--mom is meeting all goals  Desires circumcision.   Discussed r/b/a of the procedure.  Reviewed that circumcision is an elective surgical procedure and not considered medically necessary.  Reviewed the risks of the procedure including the risk of infection, bleeding, damage to surrounding structures, including scrotum, shaft, urethra and head of penis, and an undesired cosmetic effect requiring additional procedures for revision.  Consent signed.    [facility]

## 2022-03-30 NOTE — Discharge Instructions (Signed)
Please take ibuprofen 600mg  every 6 hours as needed for pain.  You may also add acetaminophen 1000 mg every 6 hours with the ibuprofen

## 2022-03-30 NOTE — Discharge Summary (Signed)
Postpartum Discharge Summary  Date of Service updated 03/30/22     Patient Name: Debra Stafford DOB: 20-Sep-1995 MRN: [id number]  Date of admission: 03/28/2022 Delivery date:03/29/2022  Delivering provider: Carrington Clamp  Date of discharge: 03/30/2022  Admitting diagnosis: Term pregnancy [Z34.90] Intrauterine pregnancy: [redacted]w[redacted]d     Secondary diagnosis:  Principal Problem:   Term pregnancy  Additional problems: none    Discharge diagnosis: Term Pregnancy Delivered                                              Post partum procedures: n/a Augmentation: N/A Complications: None  Hospital course: Induction of Labor With Vaginal Delivery   26 y.o. yo [id number] at [redacted]w[redacted]d was admitted to the hospital 03/28/2022 for induction of labor.  Indication for induction: PROM.  Patient had an labor course complicated by n/a Membrane Rupture Time/Date: 4:00 PM ,03/28/2022   Delivery Method:Vaginal, Vacuum (Extractor)  Episiotomy: None  Lacerations:  2nd degree;Perineal  Details of delivery can be found in separate delivery note.  Patient had a postpartum course complicated by none. Patient is discharged home 03/30/22.  Newborn Data: Birth date:03/29/2022  Birth time:12:53 AM  Gender:Female  Living status:Living  Apgars:8 ,9  Weight:3270 g    Physical exam  Vitals:   03/29/22 1545 03/29/22 2019 03/30/22 0541 03/30/22 1400  BP: 92/60 100/64 (!) 90/54 127/66  Pulse: 82 79 77 73  Resp: 18 17 17 18   Temp: 98.1 F (36.7 C) 97.6 F (36.4 C) 98 F (36.7 C) 98 F (36.7 C)  TempSrc: Oral Oral Oral Oral  SpO2:   99%    General: alert, cooperative, and no distress Lochia: appropriate Uterine Fundus: firm Incision: N/A DVT Evaluation: No evidence of DVT seen on physical exam. Labs: Lab Results  Component Value Date   WBC 22.4 (H) 03/29/2022   HGB 8.0 (L) 03/29/2022   HCT 24.9 (L) 03/29/2022   MCV 83.0 03/29/2022   PLT 214 03/29/2022      Latest Ref Rng & Units 08/30/2021   10:20 AM   CMP  Glucose 70 - 99 mg/dL 09/01/2021   BUN 6 - 20 mg/dL 11   Creatinine 932 - 1.00 mg/dL 6.71   Sodium 2.45 - 809 mmol/L 134   Potassium 3.5 - 5.1 mmol/L 3.5   Chloride 98 - 111 mmol/L 103   CO2 22 - 32 mmol/L 21   Calcium 8.9 - 10.3 mg/dL 9.1   Total Protein 6.5 - 8.1 g/dL 6.7   Total Bilirubin 0.3 - 1.2 mg/dL 0.7   Alkaline Phos 38 - 126 U/L 52   AST 15 - 41 U/L 16   ALT 0 - 44 U/L 13    Edinburgh Score:    03/29/2022    8:10 AM  Edinburgh Postnatal Depression Scale Screening Tool  I have been able to laugh and see the funny side of things. 0  I have looked forward with enjoyment to things. 0  I have blamed myself unnecessarily when things went wrong. 0  I have been anxious or worried for no good reason. 0  I have felt scared or panicky for no good reason. 0  Things have been getting on top of me. 0  I have been so unhappy that I have had difficulty sleeping. 0  I have felt sad or miserable. 0  I have been  so unhappy that I have been crying. 0  The thought of harming myself has occurred to me. 0  Edinburgh Postnatal Depression Scale Total 0      After visit meds:  Allergies as of 03/30/2022       Reactions   Codeine Anaphylaxis   Hives, fever, throat closes   Sulfamethoxazole-trimethoprim Hives, Rash   Fever 105F, bleeding        Medication List     STOP taking these medications    metoCLOPramide 10 MG tablet Commonly known as: REGLAN   metroNIDAZOLE 1 % gel Commonly known as: METROGEL   promethazine 25 MG suppository Commonly known as: PHENERGAN       TAKE these medications    prenatal multivitamin Tabs tablet Take 1 tablet by mouth daily at 12 noon.         Discharge home in stable condition Infant Feeding: Breast Infant Disposition:home with mother Discharge instruction: per After Visit Summary and Postpartum booklet. Activity: Advance as tolerated. Pelvic rest for 6 weeks.  Diet: routine diet Anticipated Birth Control:  Unsure Postpartum Appointment:6 weeks Additional Postpartum F/U:  n/a Future Appointments:No future appointments. Follow up Visit:  Follow-up Information     [facility] Follow up in 6 week(s).   Contact information: [address]  [address] [place] [address] [phone]                     03/30/2022 [facility] GEFFEL Chestine Spore, MD

## 2022-04-06 ENCOUNTER — Telehealth (HOSPITAL_COMMUNITY): Payer: Self-pay

## 2022-04-06 NOTE — Telephone Encounter (Signed)
Patient reports feeling good. "How long does it take for the stitches to dissolve?" RN reviewed normal healing process or the perineum and peri care. Patient declines any other questions/concerns about her health and healing.  Patient reports that baby is doing well. Eating, peeing/pooping, and gaining weight well. Baby sleeps in a bassinet. RN reviewed ABC's of safe sleep with patient. Patient declines any questions or concerns about baby.  EPDS score is 0.  Marcelino Duster [facility] 04/06/22,1554

## 2022-05-09 LAB — HM PAP SMEAR: HM Pap smear: NEGATIVE

## 2022-07-24 ENCOUNTER — Emergency Department (HOSPITAL_BASED_OUTPATIENT_CLINIC_OR_DEPARTMENT_OTHER)
Admission: EM | Admit: 2022-07-24 | Discharge: 2022-07-24 | Disposition: A | Discharge status: Home / Self Care | Payer: Medicaid Other | Attending: Emergency Medicine | Admitting: Emergency Medicine

## 2022-07-24 ENCOUNTER — Encounter (HOSPITAL_BASED_OUTPATIENT_CLINIC_OR_DEPARTMENT_OTHER): Payer: Self-pay

## 2022-07-24 ENCOUNTER — Other Ambulatory Visit: Payer: Self-pay

## 2022-07-24 ENCOUNTER — Emergency Department (HOSPITAL_BASED_OUTPATIENT_CLINIC_OR_DEPARTMENT_OTHER): Payer: Medicaid Other

## 2022-07-24 DIAGNOSIS — J40 Bronchitis, not specified as acute or chronic: Secondary | ICD-10-CM | POA: Insufficient documentation

## 2022-07-24 DIAGNOSIS — R0602 Shortness of breath: Secondary | ICD-10-CM | POA: Diagnosis present

## 2022-07-24 DIAGNOSIS — Z20822 Contact with and (suspected) exposure to covid-19: Secondary | ICD-10-CM | POA: Diagnosis not present

## 2022-07-24 LAB — RESP PANEL BY RT-PCR (RSV, FLU A&B, COVID)  RVPGX2
Influenza A by PCR: NEGATIVE
Influenza B by PCR: NEGATIVE
Resp Syncytial Virus by PCR: NEGATIVE
SARS Coronavirus 2 by RT PCR: NEGATIVE

## 2022-07-24 MED ORDER — PREDNISONE 50 MG PO TABS
60.0000 mg | ORAL_TABLET | Freq: Once | ORAL | Status: AC
  Administered 2022-07-24: 60 mg via ORAL
  Filled 2022-07-24: qty 1

## 2022-07-24 MED ORDER — ALBUTEROL SULFATE HFA 108 (90 BASE) MCG/ACT IN AERS
2.0000 | INHALATION_SPRAY | Freq: Once | RESPIRATORY_TRACT | Status: AC
  Administered 2022-07-24: 2 via RESPIRATORY_TRACT
  Filled 2022-07-24: qty 6.7

## 2022-07-24 MED ORDER — AZITHROMYCIN 250 MG PO TABS: 250.0000 mg | ORAL_TABLET | Freq: Every day | ORAL | 0 refills | Status: DC

## 2022-07-24 MED ORDER — IPRATROPIUM-ALBUTEROL 0.5-2.5 (3) MG/3ML IN SOLN
3.0000 mL | Freq: Once | RESPIRATORY_TRACT | Status: AC
  Administered 2022-07-24: 3 mL via RESPIRATORY_TRACT
  Filled 2022-07-24: qty 3

## 2022-07-24 MED ORDER — PREDNISONE 20 MG PO TABS: 40.0000 mg | ORAL_TABLET | Freq: Every day | ORAL | 0 refills | Status: AC

## 2022-07-24 MED ORDER — ALBUTEROL SULFATE (2.5 MG/3ML) 0.083% IN NEBU
2.5000 mg | INHALATION_SOLUTION | Freq: Once | RESPIRATORY_TRACT | Status: AC
  Administered 2022-07-24: 2.5 mg via RESPIRATORY_TRACT
  Filled 2022-07-24: qty 3

## 2022-07-24 NOTE — Discharge Instructions (Signed)
Follow-up with your primary care doctor.  Take 2 puffs of albuterol every 4-6 hours as needed.  Take next dose of steroids tomorrow.  Start antibiotics at home.

## 2022-07-24 NOTE — ED Notes (Signed)
Discharge instructions reviewed with patient. Patient verbalizes understanding, no further questions at this time. Medications/prescriptions and follow up information provided. No acute distress noted at time of departure.  

## 2022-07-24 NOTE — ED Provider Triage Note (Signed)
Emergency Medicine Provider Triage Evaluation Note  Debra Stafford , a 27 y.o. female  was evaluated in triage.  Pt complains of cough and shortness of breath.  Review of Systems  Positive: Cough, wheezing Negative: edema  Physical Exam  BP 93/79 (BP Location: Right Arm)   Pulse (!) 106   Temp 98.2 F (36.8 C) (Oral)   Resp (!) 27   Ht 1.676 m ('5\' 6"'$ )   Wt 61.7 kg   LMP 07/19/2022 (Exact Date)   SpO2 98%   Breastfeeding No   BMI 21.95 kg/m  Gen:   Awake, anxious Resp:  Tachypneic, wheezing MSK:   Moves extremities without difficulty  Other:    Medical Decision Making  Medically screening exam initiated at 6:21 AM.  Appropriate orders placed.  Debra Stafford was informed that the remainder of the evaluation will be completed by another provider, this initial triage assessment does not replace that evaluation, and the importance of remaining in the ED until their evaluation is complete.  Start nebs, prednisone, check CXR/EKG   [name], MD 07/24/22 [phone]

## 2022-07-24 NOTE — ED Provider Notes (Signed)
[facility] Provider Note   CSN: [id number] Arrival date & time: 07/24/22  0605     History  Chief Complaint  Patient presents with   Shortness of Breath   Cough    Debra Stafford is a 27 y.o. female.  Patient here with cough and congestion for the last couple days.  She is noticed maybe some wheezing and.  History of reactive airway disease in the family.  Denies any recent surgery or travel.  No chest pain or exertional chest pain.  Denies any fevers or chills.  Nothing makes it worse or better.  However, she did try her mother's inhaler at home which seemed to help.  She states that inhaler she in triage has helped as well.  Denies any numbness, tingling, leg swelling, recent illness.  The history is provided by the patient.       Home Medications Prior to Admission medications   Medication Sig Start Date End Date Taking? Authorizing Provider  azithromycin (ZITHROMAX) 250 MG tablet Take 1 tablet (250 mg total) by mouth daily. Take first 2 tablets together, then 1 every day until finished. 07/24/22  Yes Janneth Krasner, DO  predniSONE (DELTASONE) 20 MG tablet Take 2 tablets (40 mg total) by mouth daily for 4 days. 07/24/22 07/28/22 Yes Artice Holohan, DO  Prenatal Vit-Fe Fumarate-FA (PRENATAL MULTIVITAMIN) TABS tablet Take 1 tablet by mouth daily at 12 noon.    [provider]      Allergies    Codeine and Sulfamethoxazole-trimethoprim    Review of Systems   Review of Systems  Physical Exam Updated Vital Signs BP 93/79 (BP Location: Right Arm)   Pulse (!) 106   Temp 98.2 F (36.8 C) (Oral)   Resp (!) 27   Ht '5\' 6"'$  (1.676 m)   Wt 61.7 kg   LMP 07/19/2022 (Exact Date)   SpO2 100%   Breastfeeding No   BMI 21.95 kg/m  Physical Exam Vitals and nursing note reviewed.  Constitutional:      General: She is not in acute distress.    Appearance: She is well-developed. She is not ill-appearing.  HENT:     Head:  Normocephalic and atraumatic.     Mouth/Throat:     Mouth: Mucous membranes are moist.  Eyes:     Extraocular Movements: Extraocular movements intact.     Conjunctiva/sclera: Conjunctivae normal.     Pupils: Pupils are equal, round, and reactive to light.  Cardiovascular:     Rate and Rhythm: Normal rate and regular rhythm.     Pulses: Normal pulses.     Heart sounds: Normal heart sounds. No murmur heard. Pulmonary:     Effort: Pulmonary effort is normal. No respiratory distress.     Breath sounds: Normal breath sounds. No decreased breath sounds.  Abdominal:     Palpations: Abdomen is soft.     Tenderness: There is no abdominal tenderness.  Musculoskeletal:        General: No swelling.     Cervical back: Normal range of motion and neck supple.  Skin:    General: Skin is warm and dry.     Capillary Refill: Capillary refill takes less than 2 seconds.  Neurological:     Mental Status: She is alert.  Psychiatric:        Mood and Affect: Mood normal.     ED Results / Procedures / Treatments   Labs (all labs ordered are listed, but only  abnormal results are displayed) Labs Reviewed  RESP PANEL BY RT-PCR (RSV, FLU A&B, COVID)  [id number]    EKG EKG Interpretation  Date/Time:  Wednesday July 24 2022 06:25:32 EST Ventricular Rate:  96 PR Interval:  114 QRS Duration: 80 QT Interval:  358 QTC Calculation: 453 R Axis:   94 Text Interpretation: Sinus rhythm Borderline short PR interval Borderline right axis deviation Interpretation limited secondary to artifact Confirmed by [name] [phone]) on 07/24/2022 6:28:35 AM  Radiology No results found.  Procedures Procedures    Medications Ordered in ED Medications  ipratropium-albuterol (DUONEB) 0.5-2.5 (3) MG/3ML nebulizer solution 3 mL (3 mLs Nebulization Given 07/24/22 0627)  albuterol (PROVENTIL) (2.5 MG/3ML) 0.083% nebulizer solution 2.5 mg (2.5 mg Nebulization Given 07/24/22 0627)  predniSONE (DELTASONE) tablet 60 mg (60  mg Oral Given 07/24/22 0629)  albuterol (VENTOLIN HFA) 108 (90 Base) MCG/ACT inhaler 2 puff (2 puffs Inhalation Given 07/24/22 0725)    ED Course/ Medical Decision Making/ A&P                             Medical Decision Making Amount and/or Complexity of Data Reviewed Radiology: ordered. ECG/medicine tests: ordered.  Risk Prescription drug management.   Debra Stafford is here with cough and congestion.  Normal vitals.  No fever.  EKG already done shows sinus rhythm.  No ischemic changes.  She got breathing treatment and steroids in triage for wheezing which she says is greatly helped her symptoms.  Does not have any chest pain or shortness of breath anymore.  Overall I have no suspicion for ACS or PE or major infectious process.  Chest x-ray per my review interpretation shows no evidence of pneumonia or pneumothorax.  Overall sounds like she is got a bronchitis.  May be a resolving viral process.  Her COVID and flu test were negative.  Will give her a Zithromax and steroids for suspected bronchitis.  Understands return precautions.  Discharged in good condition.  This chart was dictated using voice recognition software.  Despite best efforts to proofread,  errors can occur which can change the documentation meaning.         Final Clinical Impression(s) / ED Diagnoses Final diagnoses:  Bronchitis    Rx / DC Orders ED Discharge Orders          Ordered    predniSONE (DELTASONE) 20 MG tablet  Daily        07/24/22 0731    azithromycin (ZITHROMAX) 250 MG tablet  Daily        07/24/22 0731              Lennice Sites, DO 07/24/22 [phone]

## 2022-07-24 NOTE — ED Triage Notes (Signed)
Pt tachypneic at this time with c/o SOB that began last night. Pt reports she woke up today and it is worse. Used her mother's neb tx with mild help. No hx of asthma. Pt reports chest tightness and pain in the back.

## 2023-03-24 LAB — OB RESULTS CONSOLE RUBELLA ANTIBODY, IGM: Rubella: IMMUNE

## 2023-03-24 LAB — OB RESULTS CONSOLE HEPATITIS B SURFACE ANTIGEN: Hepatitis B Surface Ag: NEGATIVE

## 2023-03-24 LAB — OB RESULTS CONSOLE RPR: RPR: NONREACTIVE

## 2023-03-24 LAB — OB RESULTS CONSOLE HIV ANTIBODY (ROUTINE TESTING): HIV: NONREACTIVE

## 2023-05-18 ENCOUNTER — Encounter (HOSPITAL_COMMUNITY): Payer: Self-pay | Admitting: *Deleted

## 2023-05-18 ENCOUNTER — Inpatient Hospital Stay (HOSPITAL_COMMUNITY)
Admission: AD | Admit: 2023-05-18 | Discharge: 2023-05-18 | Disposition: A | Discharge status: Home / Self Care | Payer: Medicaid Other | Attending: Obstetrics & Gynecology | Admitting: Obstetrics & Gynecology

## 2023-05-18 DIAGNOSIS — M545 Low back pain, unspecified: Secondary | ICD-10-CM | POA: Insufficient documentation

## 2023-05-18 DIAGNOSIS — Z3A18 18 weeks gestation of pregnancy: Secondary | ICD-10-CM | POA: Diagnosis not present

## 2023-05-18 DIAGNOSIS — O26891 Other specified pregnancy related conditions, first trimester: Secondary | ICD-10-CM

## 2023-05-18 DIAGNOSIS — M549 Dorsalgia, unspecified: Secondary | ICD-10-CM

## 2023-05-18 DIAGNOSIS — O368120 Decreased fetal movements, second trimester, not applicable or unspecified: Secondary | ICD-10-CM | POA: Insufficient documentation

## 2023-05-18 DIAGNOSIS — O99891 Other specified diseases and conditions complicating pregnancy: Secondary | ICD-10-CM | POA: Insufficient documentation

## 2023-05-18 DIAGNOSIS — R1030 Lower abdominal pain, unspecified: Secondary | ICD-10-CM | POA: Diagnosis present

## 2023-05-18 LAB — WET PREP, GENITAL
Clue Cells Wet Prep HPF POC: NONE SEEN
Sperm: NONE SEEN
Trich, Wet Prep: NONE SEEN
WBC, Wet Prep HPF POC: >=10 — AB (ref <10)
Yeast Wet Prep HPF POC: NONE SEEN

## 2023-05-18 LAB — URINALYSIS, ROUTINE W REFLEX MICROSCOPIC
Bilirubin Urine: NEGATIVE
Glucose, UA: NEGATIVE mg/dL
Hgb urine dipstick: NEGATIVE
Ketones, ur: NEGATIVE mg/dL
Leukocytes,Ua: NEGATIVE
Nitrite: NEGATIVE
Protein, ur: NEGATIVE mg/dL
Specific Gravity, Urine: 1.017 (ref 1.005–1.030)
pH: 8 (ref 5.0–8.0)

## 2023-05-18 MED ORDER — MISC. DEVICES MISC: 0 refills | Status: DC

## 2023-05-18 MED ORDER — CYCLOBENZAPRINE HCL 5 MG PO TABS
10.0000 mg | ORAL_TABLET | Freq: Three times a day (TID) | ORAL | Status: DC | PRN
  Administered 2023-05-18: 10 mg via ORAL
  Filled 2023-05-18: qty 2

## 2023-05-18 MED ORDER — ACETAMINOPHEN 500 MG PO TABS
1000.0000 mg | ORAL_TABLET | Freq: Once | ORAL | Status: AC
  Administered 2023-05-18: 1000 mg via ORAL
  Filled 2023-05-18: qty 2

## 2023-05-18 MED ORDER — CYCLOBENZAPRINE HCL 10 MG PO TABS: 5.0000 mg | ORAL_TABLET | Freq: Three times a day (TID) | ORAL | 0 refills | Status: DC | PRN

## 2023-05-18 NOTE — Discharge Instructions (Signed)

## 2023-05-18 NOTE — MAU Note (Signed)
Debra Stafford is a 27 y.o. at [redacted]w[redacted]d here in MAU reporting: is having cramping in lower abd and lower back, woke her at 0400.  Had some mild cramping last night, has really intensified.  Did not take anything for the pain.  Hasn't felt the baby move this morning.  No bleeding,  d/c is pale yellow- her normal, no odor or irritation.  Denies urinary problems. No diarrhea or constipation.  Onset of complaint: 0400 Pain score: 7 Vitals:   05/18/23 0827  BP: (!) 101/57  Pulse: 92  Resp: 17  Temp: 97.8 F (36.6 C)  SpO2: 99%     FHT:146 Lab orders placed from triage:  urine

## 2023-05-18 NOTE — MAU Provider Note (Cosign Needed)
Chief Complaint:  Decreased Fetal Movement, Abdominal Pain, and Back Pain  HPI  HPI: Debra Stafford is a 27 y.o. [phone] at 67w0dwho presents to maternity admissions reporting 7/10 cramping in lower abdomen and back that woke her at 0400. As not taken anything for pain. Has not felt baby move this morning, typically does. Pale yellow vaginal discharge, without odor or irritation. Denies F/C/dysuria, hx stones.   She reports good fetal movement, denies LOF, vaginal bleeding, vaginal itching/burning, urinary symptoms, h/a, dizziness, n/v, diarrhea, constipation or fever/chills.  She denies headache, visual changes or RUQ abdominal pain.  Of note treated for candida vaginitis 12/2 with terconazole cream x 1 week.   OB care provided by Dr. Mindi Slicker  Past Medical History: Past Medical History:  Diagnosis Date   Anemia    Anxiety    Headache    UTI (urinary tract infection)    Vasovagal episode     Past obstetric history: OB History  Gravida Para Term Preterm AB Living  4 1 1  0 2 1  SAB IAB Ectopic Multiple Live Births  2 0 0 0 1    # Outcome Date GA Lbr Len/2nd Weight Sex Type Anes PTL Lv  4 Current           3 Term 03/29/22 [redacted]w[redacted]d 07:53 / 01:00 3270 g M Vag-Vacuum EPI  LIV     Birth Comments: wnl  2 SAB 2022          1 SAB 2018            Past Surgical History: Past Surgical History:  Procedure Laterality Date   NO PAST SURGERIES      Family History: Family History  Problem Relation Age of Onset   Depression Mother    Diabetes Mother    Aneurysm Father    Asthma Father    Hypertension Father    High Cholesterol Father     Social History: Social History   Tobacco Use   Smoking status: Never   Smokeless tobacco: Never  Vaping Use   Vaping status: Never Used  Substance Use Topics   Alcohol use: Not Currently    Comment: occ   Drug use: No    Allergies:  Allergies  Allergen Reactions   Codeine Anaphylaxis    Hives, fever, throat closes   Latex Hives and  Swelling   Sulfamethoxazole-Trimethoprim Hives and Rash    Fever 105F, bleeding     Meds:  Medications Prior to Admission  Medication Sig Dispense Refill Last Dose/Taking   ferrous sulfate 325 (65 FE) MG tablet Take 325 mg by mouth every other day.   05/17/2023   azithromycin (ZITHROMAX) 250 MG tablet Take 1 tablet (250 mg total) by mouth daily. Take first 2 tablets together, then 1 every day until finished. 6 tablet 0    Prenatal Vit-Fe Fumarate-FA (PRENATAL MULTIVITAMIN) TABS tablet Take 1 tablet by mouth daily at 12 noon.       I have reviewed patient's Past Medical Hx, Surgical Hx, Family Hx, Social Hx, medications and allergies.   ROS:  Review of Systems Other systems negative  Physical Exam  Patient Vitals for the past 24 hrs:  BP Temp Temp src Pulse Resp SpO2 Height Weight  05/18/23 0827 (!) 101/57 97.8 F (36.6 C) Oral 92 17 99 % 5\' 6"  (1.676 m) 60.9 kg   Constitutional: Well-developed, well-nourished female in no acute distress.  Cardiovascular: normal rate and rhythm Respiratory: normal effort, clear to auscultation bilaterally  GI: Abd soft, non-tender, gravid appropriate for gestational age.   No rebound or guarding. MS: Extremities nontender, no edema, normal ROM Neurologic: Alert and oriented x 4.  GU: Neg CVAT.  PELVIC EXAM: Cervix pink, visually closed multiparous, without lesion, moderate white creamy discharge, vaginal walls and external genitalia normal    FHT:  140, active fetus on bedside US   Labs: Results for orders placed or performed during the hospital encounter of 05/18/23 (from the past 24 hours)  Urinalysis, Routine w reflex microscopic -Urine, Clean Catch     Status: Abnormal   Collection Time: 05/18/23  8:32 AM  Result Value Ref Range   Color, Urine YELLOW YELLOW   APPearance HAZY (A) CLEAR   Specific Gravity, Urine 1.017 1.005 - 1.030   pH 8.0 5.0 - 8.0   Glucose, UA NEGATIVE NEGATIVE mg/dL   Hgb urine dipstick NEGATIVE NEGATIVE    Bilirubin Urine NEGATIVE NEGATIVE   Ketones, ur NEGATIVE NEGATIVE mg/dL   Protein, ur NEGATIVE NEGATIVE mg/dL   Nitrite NEGATIVE NEGATIVE   Leukocytes,Ua NEGATIVE NEGATIVE  Wet prep, genital     Status: Abnormal   Collection Time: 05/18/23  9:01 AM   Specimen: Vaginal  Result Value Ref Range   Yeast Wet Prep HPF POC NONE SEEN NONE SEEN   Trich, Wet Prep NONE SEEN NONE SEEN   Clue Cells Wet Prep HPF POC NONE SEEN NONE SEEN   WBC, Wet Prep HPF POC >=10 (A) <10   Sperm NONE SEEN       Imaging:  No results found.  MAU Course/MDM: I have reviewed the triage vital signs and the nursing notes.   Pertinent labs & imaging results that were available during my care of the patient were reviewed by me and considered in my medical decision making (see chart for details).      I have reviewed her medical records including past results, notes and treatments.   I have ordered labs and reviewed results.   Treatments in MAU included FHT, UA, Wet prep, GC/Chlamydia swab, Tylenol, Flexeril.    Assessment: 1. [redacted] weeks gestation of pregnancy   2. Decreased fetal movements in second trimester, single or unspecified fetus   3. Back pain affecting pregnancy in second trimester    Active fetus on bedside US. FHT 140.   Back pain likely MSK given reassuring cervical exam active employment at long term care - Trial tylenol and flexeril - much improved. Rx sent to pharmacy for Flexeril.  - Encouraged maternity belt  Plan: Discharge home Preterm labor precautions and fetal kick counts Follow up in Office for prenatal visits and recheck  Follow-up Information     Edwinna Areola, DO Follow up.   Specialty: Obstetrics and Gynecology Why: As scheduled for prenatal care Contact information: [address] [place] [address] [place] [address] [phone]                Pt stable at time of discharge.  Wyn Forster, MD FMOB Fellow, Faculty practice [facility]  Healthcare  05/18/2023 10:00 AM

## 2023-05-19 LAB — GC/CHLAMYDIA PROBE AMP (~~LOC~~) NOT AT ARMC
Chlamydia: NEGATIVE
Comment: NEGATIVE
Comment: NORMAL
Neisseria Gonorrhea: NEGATIVE

## 2023-05-21 NOTE — L&D Delivery Note (Signed)
 DELIVERY NOTE  Pt complete and at +2 station with urge to push. Epidural controlling pain. Pt pushed and delivered a viable female infant in LOA position. Anterior and posterior shoulders spontaneously delivered with next two pushes; body easily followed next. Infant placed on mothers abdomen and bulb suction of mouth and nose performed. Cord was then clamped and cut by FOB. Cord blood obtained, 3VC. Baby had a vigorous spontaneous cry noted. Placenta then delivered at 0708 intact. Fundal massage performed and pitocin  per protocol. Fundus firm. The following lacerations were noted: NONE, EBL 100cc. Mother and baby stable. Counts correct   Infant time: 0705 Gender: female, DESIRES CIRC Placenta time: 0708 Apgars: 9/9 Weight: pending skin-to-skin

## 2023-06-24 ENCOUNTER — Encounter: Payer: Self-pay | Admitting: Neurology

## 2023-07-06 ENCOUNTER — Encounter (HOSPITAL_COMMUNITY): Payer: Self-pay | Admitting: *Deleted

## 2023-07-06 ENCOUNTER — Other Ambulatory Visit: Payer: Self-pay

## 2023-07-06 ENCOUNTER — Emergency Department (HOSPITAL_COMMUNITY): Payer: Medicaid Other

## 2023-07-06 ENCOUNTER — Emergency Department (HOSPITAL_COMMUNITY)
Admission: EM | Admit: 2023-07-06 | Discharge: 2023-07-07 | Disposition: A | Discharge status: Home / Self Care | Payer: Medicaid Other | Attending: Emergency Medicine | Admitting: Emergency Medicine

## 2023-07-06 ENCOUNTER — Inpatient Hospital Stay (HOSPITAL_COMMUNITY)
Admission: AD | Admit: 2023-07-06 | Discharge: 2023-07-06 | Disposition: A | Discharge status: Other Acute Inpatient Hospital | Payer: Medicaid Other | Source: Home / Self Care | Attending: Student | Admitting: Student

## 2023-07-06 DIAGNOSIS — O99352 Diseases of the nervous system complicating pregnancy, second trimester: Secondary | ICD-10-CM | POA: Insufficient documentation

## 2023-07-06 DIAGNOSIS — E860 Dehydration: Secondary | ICD-10-CM | POA: Diagnosis not present

## 2023-07-06 DIAGNOSIS — Z91040 Latex allergy status: Secondary | ICD-10-CM | POA: Insufficient documentation

## 2023-07-06 DIAGNOSIS — Z3A25 25 weeks gestation of pregnancy: Secondary | ICD-10-CM | POA: Insufficient documentation

## 2023-07-06 DIAGNOSIS — R519 Headache, unspecified: Secondary | ICD-10-CM | POA: Insufficient documentation

## 2023-07-06 DIAGNOSIS — O26892 Other specified pregnancy related conditions, second trimester: Secondary | ICD-10-CM | POA: Insufficient documentation

## 2023-07-06 DIAGNOSIS — Z3A22 22 weeks gestation of pregnancy: Secondary | ICD-10-CM | POA: Insufficient documentation

## 2023-07-06 DIAGNOSIS — R4781 Slurred speech: Secondary | ICD-10-CM | POA: Diagnosis present

## 2023-07-06 DIAGNOSIS — O99282 Endocrine, nutritional and metabolic diseases complicating pregnancy, second trimester: Secondary | ICD-10-CM | POA: Insufficient documentation

## 2023-07-06 DIAGNOSIS — G4489 Other headache syndrome: Secondary | ICD-10-CM | POA: Insufficient documentation

## 2023-07-06 DIAGNOSIS — R42 Dizziness and giddiness: Secondary | ICD-10-CM

## 2023-07-06 LAB — COMPREHENSIVE METABOLIC PANEL
ALT: 9 U/L (ref 0–44)
AST: 14 U/L — ABNORMAL LOW (ref 15–41)
Albumin: 2.8 g/dL — ABNORMAL LOW (ref 3.5–5.0)
Alkaline Phosphatase: 73 U/L (ref 38–126)
Anion gap: 12 (ref 5–15)
BUN: 10 mg/dL (ref 6–20)
CO2: 20 mmol/L — ABNORMAL LOW (ref 22–32)
Calcium: 8.9 mg/dL (ref 8.9–10.3)
Chloride: 105 mmol/L (ref 98–111)
Creatinine, Ser: 0.55 mg/dL (ref 0.44–1.00)
GFR, Estimated: >60 mL/min (ref >60)
Glucose, Bld: 71 mg/dL (ref 70–99)
Potassium: 3.5 mmol/L (ref 3.5–5.1)
Sodium: 137 mmol/L (ref 135–145)
Total Bilirubin: 0.3 mg/dL (ref 0.0–1.2)
Total Protein: 6.5 g/dL (ref 6.5–8.1)

## 2023-07-06 LAB — LIPASE, BLOOD: Lipase: 32 U/L (ref 11–51)

## 2023-07-06 LAB — URINALYSIS, ROUTINE W REFLEX MICROSCOPIC
Bilirubin Urine: NEGATIVE
Glucose, UA: NEGATIVE mg/dL
Hgb urine dipstick: NEGATIVE
Ketones, ur: NEGATIVE mg/dL
Leukocytes,Ua: NEGATIVE
Nitrite: NEGATIVE
Protein, ur: NEGATIVE mg/dL
Specific Gravity, Urine: 1.008 (ref 1.005–1.030)
pH: 5 (ref 5.0–8.0)

## 2023-07-06 LAB — CBC
HCT: 32.2 % — ABNORMAL LOW (ref 36.0–46.0)
Hemoglobin: 10.2 g/dL — ABNORMAL LOW (ref 12.0–15.0)
MCH: 26.8 pg (ref 26.0–34.0)
MCHC: 31.7 g/dL (ref 30.0–36.0)
MCV: 84.5 fL (ref 80.0–100.0)
Platelets: 282 10*3/uL (ref 150–400)
RBC: 3.81 MIL/uL — ABNORMAL LOW (ref 3.87–5.11)
RDW: 13.6 % (ref 11.5–15.5)
WBC: 11.9 10*3/uL — ABNORMAL HIGH (ref 4.0–10.5)
nRBC: 0 % (ref 0.0–0.2)

## 2023-07-06 LAB — CBG MONITORING, ED
Glucose-Capillary: 69 mg/dL — ABNORMAL LOW (ref 70–99)
Glucose-Capillary: 118 mg/dL — ABNORMAL HIGH (ref 70–99)
Glucose-Capillary: 108 mg/dL — ABNORMAL HIGH (ref 70–99)

## 2023-07-06 MED ORDER — SODIUM CHLORIDE 0.9 % IV BOLUS (SEPSIS)
1000.0000 mL | Freq: Once | INTRAVENOUS | Status: AC
  Administered 2023-07-06: 1000 mL via INTRAVENOUS

## 2023-07-06 NOTE — ED Triage Notes (Signed)
The pt is c/o feeling dizzy all day  and feeling weak    the symptoms started at 1200n nauseated and she vomited when she arrived   lmp 7 months pregnant

## 2023-07-06 NOTE — ED Provider Notes (Signed)
[place] EMERGENCY DEPARTMENT AT [facility] Provider Note   CSN: [id number] Arrival date & time: 07/06/23  1905     History {Add pertinent medical, surgical, social history, OB history to HPI:1} Chief Complaint  Patient presents with   Dizziness    Debra Stafford is a 28 y.o. female.  The history is provided by the patient.  Dizziness Associated symptoms: headaches, nausea and vomiting   Associated symptoms: no chest pain, no shortness of breath and no weakness   Patient is currently [redacted] weeks pregnant & otherwise healthy.  She reports for her entire pregnancy she has had intermittent episodes of stabbing pain in her eyes and had, episodes of dizziness and feeling like she might pass out.  She will also have intermittent episodes of numbness in her extremities.  She has already has a referral to neurology next month.  However the symptoms have been increasing, last episode was around 48 hours ago. However earlier today she had return of symptoms and her significant other reports patient has been having confusion and difficulty speaking.  There was no focal weakness.  No syncope.  Upon arrival to our triage she had an episode of nausea vomiting and felt like she might pass out No previous cardiac disease is known.  No history of seizures   She denies any visual loss but reports her vision is foggy but improved Home Medications Prior to Admission medications   Medication Sig Start Date End Date Taking? Authorizing Provider  cyclobenzaprine (FLEXERIL) 10 MG tablet Take 0.5 tablets (5 mg total) by mouth 3 (three) times daily as needed for muscle spasms. 05/18/23   Wyn Forster, MD  ferrous sulfate 325 (65 FE) MG tablet Take 325 mg by mouth every other day.    [provider]  Misc. Devices MISC Dispense one maternity belt for patient 05/18/23   Wyn Forster, MD  Prenatal Vit-Fe Fumarate-FA (PRENATAL MULTIVITAMIN) TABS tablet Take 1 tablet by mouth daily at 12  noon.    [provider]      Allergies    Codeine, Latex, and Sulfamethoxazole-trimethoprim    Review of Systems   Review of Systems  Constitutional:  Negative for fever.  Eyes:  Positive for visual disturbance. Negative for pain.  Respiratory:  Negative for shortness of breath.   Cardiovascular:  Negative for chest pain.  Gastrointestinal:  Positive for nausea and vomiting. Negative for abdominal pain.  Genitourinary:  Negative for vaginal bleeding.  Neurological:  Positive for dizziness, speech difficulty, light-headedness, numbness and headaches. Negative for syncope, facial asymmetry and weakness.    Physical Exam Updated Vital Signs BP 101/64 (BP Location: Right Arm)   Pulse 96   Temp 98.6 F (37 C) (Oral)   Resp 18   Ht 1.676 m (5\' 6" )   Wt 60.9 kg   LMP 07/19/2022 (Exact Date)   SpO2 100%   BMI 21.67 kg/m  Physical Exam CONSTITUTIONAL: Well developed/well nourished HEAD: Normocephalic/atraumatic EYES: EOMI/PERRL, no nystagmus, no visual field deficit  no ptosis, conjunctiva pink ENMT: Mucous membranes moist NECK: supple no meningeal signs, no bruits CV: S1/S2 noted, no murmurs/rubs/gallops noted LUNGS: Lungs are clear to auscultation bilaterally, no apparent distress ABDOMEN: soft, gravid NEURO:Awake/alert, face symmetric, no arm or leg drift is noted Equal 5/5 strength with shoulder abduction, elbow flex/extension, wrist flex/extension in upper extremities and equal hand grips bilaterally Equal 5/5 strength with hip flexion,knee flex/extension, foot dorsi/plantar flexion Cranial nerves 3/4/5/6/11/25/08/11/12 tested and intact No past pointing Sensation to  light touch intact in all extremities EXTREMITIES: pulses normal, full ROM SKIN: warm, pale PSYCH: no abnormalities of mood noted  ED Results / Procedures / Treatments   Labs (all labs ordered are listed, but only abnormal results are displayed) Labs Reviewed  COMPREHENSIVE METABOLIC PANEL -  Abnormal; Notable for the following components:      Result Value   CO2 20 (*)    Albumin 2.8 (*)    AST 14 (*)    All other components within normal limits  CBC - Abnormal; Notable for the following components:   WBC 11.9 (*)    RBC 3.81 (*)    Hemoglobin 10.2 (*)    HCT 32.2 (*)    All other components within normal limits  URINALYSIS, ROUTINE W REFLEX MICROSCOPIC - Abnormal; Notable for the following components:   Color, Urine STRAW (*)    All other components within normal limits  CBG MONITORING, ED - Abnormal; Notable for the following components:   Glucose-Capillary 69 (*)    All other components within normal limits  CBG MONITORING, ED - Abnormal; Notable for the following components:   Glucose-Capillary 118 (*)    All other components within normal limits  CBG MONITORING, ED - Abnormal; Notable for the following components:   Glucose-Capillary 108 (*)    All other components within normal limits  LIPASE, BLOOD    EKG None  Radiology No results found.  Procedures Procedures  {Document cardiac monitor, telemetry assessment procedure when appropriate:1}  Medications Ordered in ED Medications  sodium chloride 0.9 % bolus 1,000 mL (has no administration in time range)    ED Course/ Medical Decision Making/ A&P   {   Click here for ABCD2, HEART and other calculatorsREFRESH Note before signing :1}                              Medical Decision Making Amount and/or Complexity of Data Reviewed Radiology: ordered.   This patient presents to the ED for concern of dizziness and headache, this involves an extensive number of treatment options, and is a complaint that carries with it a high risk of complications and morbidity.  The differential diagnosis includes but is not limited to CVA, intracranial hemorrhage, renal failure, urinary tract infection, electrolyte disturbance, cardiac dysrhythmia, subarachnoid hemorrhage, CVST, brain tumor    Comorbidities that  complicate the patient evaluation: Patient's presentation is complicated by their history of pregnancy  Additional history obtained: Additional history obtained from significant other Records reviewed  OB notes reviewed  Lab Tests: I Ordered, and personally interpreted labs.  The pertinent results include:  mild dehydration  Imaging Studies ordered: I ordered imaging studies including MRI brain   I independently visualized and interpreted imaging which showed *** I agree with the radiologist interpretation  Cardiac Monitoring: The patient was maintained on a cardiac monitor.  I personally viewed and interpreted the cardiac monitor which showed an underlying rhythm of:  {cardiac monitor:26849}  Medicines ordered and prescription drug management: I ordered medication including IV fluids  for dehydration and weakness  Reevaluation of the patient after these medicines showed that the patient    {resolved/improved/worsened:23923::"improved"}  Test Considered: Patient is low risk / negative by ***, therefore do not feel that *** is indicated.  Critical Interventions:  ***  Consultations Obtained: I requested consultation with the {consultation:26851}, and discussed  findings as well as pertinent plan - they recommend: ***  Reevaluation: After the  interventions noted above, I reevaluated the patient and found that they have :{resolved/improved/worsened:23923::"improved"}  Complexity of problems addressed: Patient's presentation is most consistent with  {ZOXW:96045}  Disposition: After consideration of the diagnostic results and the patient's response to treatment,  I feel that the patent would benefit from {disposition:26850}.     {Document critical care time when appropriate:1} {Document review of labs and clinical decision tools ie heart score, Chads2Vasc2 etc:1}  {Document your independent review of radiology images, and any outside records:1} {Document your discussion with  family members, caretakers, and with consultants:1} {Document social determinants of health affecting pt's care:1} {Document your decision making why or why not admission, treatments were needed:1} Final Clinical Impression(s) / ED Diagnoses Final diagnoses:  None    Rx / DC Orders ED Discharge Orders     None

## 2023-07-06 NOTE — MAU Note (Signed)
.  Debra Stafford is a 28 y.o. at [redacted]w[redacted]d here in MAU reporting: episodes of dizziness, feeling "like I'm in a dream", confusion at home per partner.  Partner reports slurred speech at home, and a few episodes of inappropriate speech. Pt states she was told to go to main ed, but states she was sent to mau by mced. Denies ctx, lof, or vag bleeding. Pt reports stabbing headache, for the past few hours.     Onset of complaint: 1200 Pain score:  Vitals:   07/06/23 1849  SpO2: 100%     FHT: 145

## 2023-07-06 NOTE — ED Provider Triage Note (Signed)
Emergency Medicine Provider Triage Evaluation Note  Debra Stafford , a 28 y.o. female  was evaluated in triage.  Pt complains of dizziness,  Pt sent here by OB.  Pt reports feeling like she is in a dream.  Pt is scheduled to see Neurology   Review of Systems  Positive: nausea  Negative:   Physical Exam  BP 101/64 (BP Location: Right Arm)   Pulse 96   Temp 98.6 F (37 C) (Oral)   Resp 18   Ht 5\' 6"  (1.676 m)   Wt 60.9 kg   LMP 07/19/2022 (Exact Date)   SpO2 100%   BMI 21.67 kg/m  Gen:   Awake, no distress   Resp:  Normal effort  MSK:   Moves extremities without difficulty  Other:   Medical Decision Making  Medically screening exam initiated at 8:31 PM.  Appropriate orders placed.  Graylin Shiver Estis was informed that the remainder of the evaluation will be completed by another provider, this initial triage assessment does not replace that evaluation, and the importance of remaining in the ED until their evaluation is complete.     Elson Areas, [place] 07/06/23 2034

## 2023-07-06 NOTE — ED Notes (Signed)
Pt notified sort NT that she was feeling like she was going to pass out. Charge RN aware.

## 2023-07-06 NOTE — MAU Provider Note (Signed)
S Ms. Debra Stafford is a 28 y.o. [phone] patient who presents to MAU today with complaint of feeling "like I'm in a dream" and partner noted that she appeared to be slurring her speech and answering questions inappropriately. She denies any hx seizure disorder. Endorses hx migraine disorder, but current headache bandlike and unlike other headaches. She reports feeling like vision is "fogged". No upper or lower extremity weakness or change in coordination. She has no pregnancy related complaints and denies LOF, ctx, VB.  O BP 97/66 (BP Location: Right Arm)   Pulse (!) 111   Temp 98.4 F (36.9 C) (Oral)   Resp 16   LMP 07/19/2022 (Exact Date)   SpO2 100%  Physical Exam Constitutional:      Appearance: She is well-developed.  Neurological:     Mental Status: She is alert.     Cranial Nerves: No cranial nerve deficit, dysarthria or facial asymmetry.     Sensory: No sensory deficit.     Motor: No weakness.     Gait: Gait normal.  Psychiatric:        Mood and Affect: Mood normal.     A/P: Medical screening exam complete Discussed case with ER physician to transfer to ER for further evaluation, who is accepting of transfer Discussed that RROB nurse can be sent over if needed   Sundra Aland, MD 07/06/2023 8:09 PM

## 2023-07-07 ENCOUNTER — Emergency Department (HOSPITAL_COMMUNITY): Payer: Medicaid Other

## 2023-07-07 NOTE — ED Notes (Signed)
 Pt ambulated to bathroom with no complaints.

## 2023-07-07 NOTE — Discharge Instructions (Signed)
   RETURN IMMEDIATELY IF  you develop new shortness of breath, chest pain, fever, have difficulty moving parts of your body (new weakness, numbness, or incoordination), sudden change in speech, vision, swallowing, or understanding, faint or develop new dizziness, severe headache, become poorly responsive or have an altered mental status compared to baseline for you, new rash, abdominal pain, or bloody stools,  Return sooner also if you develop new problems for which you have not talked to your caregiver but you feel may be emergency medical conditions.

## 2023-08-05 ENCOUNTER — Encounter: Payer: Self-pay | Admitting: Neurology

## 2023-08-05 ENCOUNTER — Ambulatory Visit (INDEPENDENT_AMBULATORY_CARE_PROVIDER_SITE_OTHER): Payer: Medicaid Other | Admitting: Neurology

## 2023-08-05 VITALS — BP 100/59 | HR 112 | Ht 66.0 in | Wt 150.2 lb

## 2023-08-05 DIAGNOSIS — G43109 Migraine with aura, not intractable, without status migrainosus: Secondary | ICD-10-CM

## 2023-08-05 DIAGNOSIS — R410 Disorientation, unspecified: Secondary | ICD-10-CM

## 2023-08-05 NOTE — Patient Instructions (Addendum)
 Good to meet you.  Schedule 1-hour EEG  2. Start daily Magnesium oxide 400mg  and Riboflavin (vitamin B2) 400mg  for migraine prevention.  3. Follow-up mid-June post-partum to assess if headaches continue and more medication options can be used

## 2023-08-05 NOTE — Progress Notes (Signed)
 NEUROLOGY CONSULTATION NOTE  Debra Stafford MRN: [id number] DOB: July 27, 1995  Referring provider: Dr. Pryor Ochoa Primary care provider: [facility]  Reason for consult:  dizziness  Dear Dr Mindi Slicker:  Thank you for your kind referral of Debra Stafford for consultation of the above symptoms. Although Debra history is well known to you, please allow me to reiterate it for the purpose of our medical record. Debra Stafford is present with Debra. Records and images were personally reviewed where available.   HISTORY OF PRESENT ILLNESS: This is a 28 year old right-handed G4P1 woman with a history of anemia, presenting for evaluation of dizziness. She is currently [redacted] weeks pregnant, due on October 19, 2023 with another baby boy. She reports that she has been feeling like she would pass out since Debra pregnancy started. Since October/November 2024, she has had episodes of stabbing pain in both eyes followed by headache lasting a couple of days where she has difficulty doing anything. She feels lightheaded when standing, things go black. She would be walking and vision gets blurred. She feels better laying down. Both arms get numb when she has headaches. When supine, Debra face feels numb, Debra ear on the side she lays on gets numb, improving when she shifts position. No loss of consciousness. She had a bad episode on 07/06/23 when she started having similar symptoms with a bad headache and felt like she was in a dream. Debra husband noted Debra speech was slurred and she was responding inappropriately. She was getting mad when he tried to correct Debra, she does not remember this. She was brought to the ER where bloodwork showed a WBC of 11.9, Hct 32.2, Hgb 10.2. UA negative. I personally reviewed MRI/MRV brain which were normal.   She reports a history of migraines in Debra teens lasting 1-2 days with photophobia, no nausea/vomiting. She had migraines during Debra pregnancy with Debra 57 month old son. She  denies any staring/unresponsive episodes, gaps in time, olfactory/gustatory hallucinations, focal weakness, myoclonic jerks. She denies any head injuries or recent infections. No palpitations, chest pain, shortness of breath. She gets around 6 hours of sleep. She had a normal birth and early development, no history of febrile convulsions, CNS infections, significant head injuries, neurosurgical procedures, or family history of seizures. No family history of migraines.     PAST MEDICAL HISTORY: Past Medical History:  Diagnosis Date   Anemia    Anxiety    Headache    UTI (urinary tract infection)    Vasovagal episode     PAST SURGICAL HISTORY: Past Surgical History:  Procedure Laterality Date   NO PAST SURGERIES      MEDICATIONS: Current Outpatient Medications on File Prior to Visit  Medication Sig Dispense Refill   ferrous sulfate 325 (65 FE) MG tablet Take 325 mg by mouth every other day.     Prenatal Vit-Fe Fumarate-FA (PRENATAL MULTIVITAMIN) TABS tablet Take 1 tablet by mouth daily at 12 noon.     Misc. Devices MISC Dispense one maternity belt for patient 1 each 0   No current facility-administered medications on file prior to visit.    ALLERGIES: Allergies  Allergen Reactions   Codeine Anaphylaxis    Hives, fever, throat closes   Latex Hives and Swelling   Sulfamethoxazole-Trimethoprim Hives and Rash    Fever 105F, bleeding     FAMILY HISTORY: Family History  Problem Relation Age of Onset   Depression Mother    Diabetes Mother  Aneurysm Father    Asthma Father    Hypertension Father    High Cholesterol Father     SOCIAL HISTORY: Social History   Socioeconomic History   Marital status: Single    Spouse name: Not on file   Number of children: Not on file   Years of education: Not on file   Highest education level: Not on file  Occupational History   Not on file  Tobacco Use   Smoking status: Never   Smokeless tobacco: Never  Vaping Use   Vaping  status: Never Used  Substance and Sexual Activity   Alcohol use: Not Currently    Comment: occ   Drug use: No   Sexual activity: Yes    Birth control/protection: Condom  Other Topics Concern   Not on file  Social History Narrative   Are you right handed or left handed? right   Are you currently employed ? 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 Family    What type of home do you live in: 1 story or 2 story?        Social Drivers of Corporate investment banker Strain: Not on file  Food Insecurity: No Food Insecurity (03/28/2022)   Hunger Vital Sign    Worried About Running Out of Food in the Last Year: Never true    Ran Out of Food in the Last Year: Never true  Transportation Needs: No Transportation Needs (03/28/2022)   PRAPARE - Administrator, Civil Service (Medical): No    Lack of Transportation (Non-Medical): No  Physical Activity: Not on file  Stress: Not on file  Social Connections: Not on file  Intimate Partner Violence: Not At Risk (03/28/2022)   Humiliation, Afraid, Rape, and Kick questionnaire    Fear of Current or Ex-Partner: No    Emotionally Abused: No    Physically Abused: No    Sexually Abused: No     PHYSICAL EXAM: Vitals:   08/05/23 1015  BP: (!) 100/59  Pulse: (!) 112  SpO2: 99%   General: No acute distress Head:  Normocephalic/atraumatic Skin/Extremities: No rash, no edema Neurological Exam: Mental status: alert and oriented to person, place, and time, no dysarthria or aphasia, Fund of knowledge is appropriate.  Attention and concentration are normal.   Cranial nerves: CN I: not tested CN II: pupils equal, round, visual fields intact CN III, IV, VI:  full range of motion, no nystagmus, no ptosis CN V: facial sensation intact CN VII: upper and lower face symmetric CN VIII: hearing intact to conversation CN XI: sternocleidomastoid and trapezius muscles intact CN XII:  tongue midline Bulk & Tone: normal, no fasciculations. Motor: 5/5 throughout with no pronator drift. Sensation: intact to light touch, cold, pin, vibration sense.  No extinction to double simultaneous stimulation.  Romberg test negative Deep Tendon Reflexes: +2 throughout Cerebellar: no incoordination on finger to nose testing Gait: narrow-based and steady, able to tandem walk adequately. Tremor: none   IMPRESSION: This is a 28 year old right-handed G4P1 woman, [redacted] weeks pregnant, presenting for evaluation of dizziness. Debra neurological exam is normal, MRI brain/MRV brain normal. Dizziness likely due to anemia/low BP. The episode of confusion on 07/06/23 is suggestive of complicated migraine. She has a history of migraines in the past (also during pregnancy) and reports stabbing pain in Debra eyes, feeling like she is in a dream state.  We will do a 1-hour EEG, although seizure is less likely. We discussed options for migraine preventative treatment in pregnancy, start magnesium oxide 400mg  and riboflavin 400mg  daily. She is due in June, we discussed follow-up post-partum to assess if further headache treatment is needed. She knows to call for any changes.    Thank you for allowing me to participate in the care of this patient. Please do not hesitate to call for any questions or concerns.   Patrcia Dolly, M.D.  CC: Dr. Mindi Slicker, [facility]

## 2023-08-08 ENCOUNTER — Non-Acute Institutional Stay (HOSPITAL_COMMUNITY)
Admission: RE | Admit: 2023-08-08 | Discharge: 2023-08-08 | Disposition: A | Discharge status: Home / Self Care | Source: Ambulatory Visit | Attending: Internal Medicine | Admitting: Internal Medicine

## 2023-08-08 DIAGNOSIS — O99019 Anemia complicating pregnancy, unspecified trimester: Secondary | ICD-10-CM | POA: Diagnosis present

## 2023-08-08 MED ORDER — SODIUM CHLORIDE 0.9 % IV SOLN: INTRAVENOUS | Status: DC | PRN

## 2023-08-08 MED ORDER — SODIUM CHLORIDE 0.9 % IV SOLN
510.0000 mg | Freq: Once | INTRAVENOUS | Status: AC
  Administered 2023-08-08: 510 mg via INTRAVENOUS
  Filled 2023-08-08: qty 17

## 2023-08-08 NOTE — Progress Notes (Signed)
 PATIENT CARE CENTER NOTE  Diagnosis: 099.019: Anemia complicating pregnancy   Provider: Ellison Hughs MD  Procedure: Feraheme 510 mg (dose #1 of 2)  Note: Patient received Feraheme 510 mg infusion. No pre-medications per orders. Pt tolerated the infusion with no adverse reaction. Pt observed for 30 minutes post infusion. Vital signs are stable. AVS printed and given to the patient. Pt advised that she should return in one week for second dose, pt advised to schedule next appointment at the front desk. Pt is alert, oriented, and ambulatory at discharge.

## 2023-08-11 ENCOUNTER — Telehealth: Payer: Self-pay | Admitting: Neurology

## 2023-08-11 NOTE — Telephone Encounter (Signed)
 Debra Stafford with [facility] called, the provider there is wanting Dr.aquino to close her notes on the appt that was 08/05/23 and send them the after visit information so they can move forward. If you have questions, you can call her back and the number provided and ext [phone]

## 2023-08-12 NOTE — Telephone Encounter (Signed)
 Done, thanks

## 2023-08-13 ENCOUNTER — Encounter (HOSPITAL_COMMUNITY)

## 2023-08-13 ENCOUNTER — Ambulatory Visit: Admitting: Neurology

## 2023-08-13 DIAGNOSIS — R410 Disorientation, unspecified: Secondary | ICD-10-CM

## 2023-08-13 DIAGNOSIS — G43109 Migraine with aura, not intractable, without status migrainosus: Secondary | ICD-10-CM | POA: Diagnosis not present

## 2023-08-13 NOTE — Progress Notes (Unsigned)
 EEG complete - results pending

## 2023-08-13 NOTE — Telephone Encounter (Signed)
 Notes faxed to [phone] at [facility]

## 2023-08-18 ENCOUNTER — Non-Acute Institutional Stay (HOSPITAL_COMMUNITY)
Admission: RE | Admit: 2023-08-18 | Discharge: 2023-08-18 | Disposition: A | Discharge status: Home / Self Care | Source: Ambulatory Visit | Attending: Internal Medicine | Admitting: Internal Medicine

## 2023-08-18 DIAGNOSIS — Z3A00 Weeks of gestation of pregnancy not specified: Secondary | ICD-10-CM | POA: Insufficient documentation

## 2023-08-18 DIAGNOSIS — O99019 Anemia complicating pregnancy, unspecified trimester: Secondary | ICD-10-CM | POA: Diagnosis present

## 2023-08-18 MED ORDER — SODIUM CHLORIDE 0.9 % IV SOLN: INTRAVENOUS | Status: DC | PRN

## 2023-08-18 MED ORDER — SODIUM CHLORIDE 0.9 % IV SOLN
510.0000 mg | Freq: Once | INTRAVENOUS | Status: AC
  Administered 2023-08-18: 510 mg via INTRAVENOUS
  Filled 2023-08-18: qty 17

## 2023-08-18 NOTE — Progress Notes (Signed)
 PATIENT CARE CENTER NOTE  Diagnosis: 099.019- Anemia complicating pregnancy   Provider: Shivaji,Shanti MD   Procedure: Feraheme 510 mg (dose #2 of 2)  Note: Patient received Feraheme 510 mg infusion via PIV. No pre-meds per orders. Pts pre infusion BP is 87/53. Pt reports feeling fine and says that her BP runs low at baseline. Pt tolerated infusion with no adverse reaction. Pt observed for 30 minutes and BP at discharge is 87/62. Pt states she feels fine with no symptoms. Pt advised that if she develops any symptoms she should contact her doctor, and report to ER. Pt verbalized understanding. AVS offered, but pt declined. Pt is alert, oriented, and ambulatory at discharge.

## 2023-08-20 NOTE — Addendum Note (Signed)
 Addended by: Van Clines on: 08/20/2023 09:42 AM   Modules accepted: Orders

## 2023-08-20 NOTE — Procedures (Signed)
 ELECTROENCEPHALOGRAM REPORT  Date of Study: 08/13/2023  Patient's Name: Debra Stafford MRN: [id number] Date of Birth: 1996-05-13  Referring Provider: Dr. Patrcia Dolly  Clinical History: This is a 28 year old pregnant woman with dizziness and episode of confusion. EEG for classification.  Medications: Prenatal vitamins, iron  Technical Summary: A multichannel digital 1-hour EEG recording measured by the international 10-20 system with electrodes applied with paste and impedances below 5000 ohms performed in our laboratory with EKG monitoring in an awake and asleep patient.  Hyperventilation was not performed. Photic stimulation was performed.  The digital EEG was referentially recorded, reformatted, and digitally filtered in a variety of bipolar and referential montages for optimal display.    Description: The patient is awake and asleep during the recording.  During maximal wakefulness, there is a symmetric, medium voltage 11 Hz posterior dominant rhythm that attenuates with eye opening.  The record is symmetric.  During drowsiness and sleep, there is an increase in theta slowing of the background.  Vertex waves and symmetric sleep spindles were seen. Photic stimulation did not elicit any abnormalities, patient reported left eye twitching during IPS (not clearly seen on video) with no EEG changes seen.  There were no epileptiform discharges or electrographic seizures seen.    EKG lead was unremarkable.  Impression: This 1-hour awake and asleep EEG is normal.    Clinical Correlation: A normal EEG does not exclude a clinical diagnosis of epilepsy.  If further clinical questions remain, prolonged EEG may be helpful.  Clinical correlation is advised.   Patrcia Dolly, M.D.

## 2023-08-22 ENCOUNTER — Encounter (HOSPITAL_COMMUNITY)

## 2023-09-26 LAB — OB RESULTS CONSOLE GBS: GBS: NEGATIVE

## 2023-09-30 ENCOUNTER — Encounter (HOSPITAL_COMMUNITY): Payer: Self-pay | Admitting: Obstetrics and Gynecology

## 2023-09-30 ENCOUNTER — Inpatient Hospital Stay (HOSPITAL_COMMUNITY)
Admission: AD | Admit: 2023-09-30 | Discharge: 2023-09-30 | Disposition: A | Discharge status: Home / Self Care | Attending: Obstetrics and Gynecology | Admitting: Obstetrics and Gynecology

## 2023-09-30 DIAGNOSIS — Z3A37 37 weeks gestation of pregnancy: Secondary | ICD-10-CM | POA: Insufficient documentation

## 2023-09-30 DIAGNOSIS — O471 False labor at or after 37 completed weeks of gestation: Secondary | ICD-10-CM | POA: Insufficient documentation

## 2023-09-30 DIAGNOSIS — O479 False labor, unspecified: Secondary | ICD-10-CM | POA: Diagnosis not present

## 2023-09-30 LAB — POCT FERN TEST: POCT Fern Test: NEGATIVE

## 2023-09-30 MED ORDER — PROMETHAZINE HCL 25 MG PO TABS
25.0000 mg | ORAL_TABLET | Freq: Once | ORAL | Status: DC
  Filled 2023-09-30: qty 1

## 2023-09-30 MED ORDER — CYCLOBENZAPRINE HCL 5 MG PO TABS
10.0000 mg | ORAL_TABLET | Freq: Once | ORAL | Status: AC
  Administered 2023-09-30: 10 mg via ORAL
  Filled 2023-09-30: qty 2

## 2023-09-30 MED ORDER — CYCLOBENZAPRINE HCL 5 MG PO TABS: 5.0000 mg | ORAL_TABLET | Freq: Three times a day (TID) | ORAL | 0 refills | Status: AC | PRN

## 2023-09-30 NOTE — MAU Note (Signed)
 Pt says UC's strong since 1030pm. PNC- last week - 2-3 cm. Denies HSV GBS- neg

## 2023-09-30 NOTE — MAU Note (Signed)
 Pt states she thinks her water broke. RN collected fern slide.

## 2023-09-30 NOTE — MAU Provider Note (Cosign Needed Addendum)
 None      S: Ms. Ezmae Limbert Desmarais is a 28 y.o. [id number] at [redacted]w[redacted]d  who presents to MAU today complaining contractions since 2230. She denies vaginal bleeding. She reported leakage of fluid while in MAU, thought her water may have broken. She reports normal fetal movement.    O: BP 105/62 (BP Location: Right Arm)   Pulse 97   Temp 98 F (36.7 C) (Oral)   Resp 14   Ht 5\' 6"  (1.676 m)   Wt 70.2 kg   LMP 07/19/2022 (Exact Date)   BMI 24.99 kg/m  GENERAL: Well-developed, well-nourished female in no acute distress.  HEAD: Normocephalic, atraumatic.  CHEST: Normal effort of breathing, regular heart rate ABDOMEN: Soft, nontender, gravid GU: Chaperone present, sterile speculum exam performed, cervix visually dilated to 3cm, thick mucus discharge in vagina, but no pooling noted  Cervical exam:  Dilation: 4 Effacement (%): 50 Station: -2 Presentation: Vertex Exam by:: [employer], RN   Fetal Monitoring: Baseline: 135 Variability: mod Accelerations: present Decelerations: absent Contractions: every 3-4 min   A/P: [facility] at [redacted]w[redacted]d here with contractions at term Negative fern and negative pooling, suspect pt noted gel from vaginal exam, membranes intact NST reactive Cervix unchanged x 2 checks Requested meds for therapeutic rest - Flexeril  and Phenergan  given (Morphine  not given as 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

## 2023-09-30 NOTE — MAU Note (Signed)
 RN offered pt to walk around and repeat cervical exam after 1 hour. Pt acceptable to POC. EFM removed per MD for pt to walk.

## 2023-10-02 ENCOUNTER — Inpatient Hospital Stay (HOSPITAL_COMMUNITY): Admitting: Anesthesiology

## 2023-10-02 ENCOUNTER — Inpatient Hospital Stay (HOSPITAL_COMMUNITY)
Admission: AD | Admit: 2023-10-02 | Discharge: 2023-10-03 | DRG: 807 | Disposition: A | Discharge status: Home / Self Care | Attending: Obstetrics and Gynecology | Admitting: Obstetrics and Gynecology

## 2023-10-02 ENCOUNTER — Other Ambulatory Visit: Payer: Self-pay

## 2023-10-02 ENCOUNTER — Encounter (HOSPITAL_COMMUNITY): Payer: Self-pay | Admitting: Obstetrics and Gynecology

## 2023-10-02 DIAGNOSIS — I959 Hypotension, unspecified: Secondary | ICD-10-CM | POA: Diagnosis present

## 2023-10-02 DIAGNOSIS — O9902 Anemia complicating childbirth: Secondary | ICD-10-CM | POA: Diagnosis present

## 2023-10-02 DIAGNOSIS — O26893 Other specified pregnancy related conditions, third trimester: Secondary | ICD-10-CM | POA: Diagnosis present

## 2023-10-02 DIAGNOSIS — Z8249 Family history of ischemic heart disease and other diseases of the circulatory system: Secondary | ICD-10-CM | POA: Diagnosis not present

## 2023-10-02 DIAGNOSIS — Z833 Family history of diabetes mellitus: Secondary | ICD-10-CM

## 2023-10-02 DIAGNOSIS — Z3A37 37 weeks gestation of pregnancy: Secondary | ICD-10-CM | POA: Diagnosis not present

## 2023-10-02 LAB — CBC
HCT: 36.7 % (ref 36.0–46.0)
Hemoglobin: 12 g/dL (ref 12.0–15.0)
MCH: 28.9 pg (ref 26.0–34.0)
MCHC: 32.7 g/dL (ref 30.0–36.0)
MCV: 88.4 fL (ref 80.0–100.0)
Platelets: 178 10*3/uL (ref 150–400)
RBC: 4.15 MIL/uL (ref 3.87–5.11)
RDW: 19.6 % — ABNORMAL HIGH (ref 11.5–15.5)
WBC: 13.1 10*3/uL — ABNORMAL HIGH (ref 4.0–10.5)
nRBC: 0 % (ref 0.0–0.2)

## 2023-10-02 LAB — TYPE AND SCREEN
ABO/RH(D): O POS
Antibody Screen: NEGATIVE

## 2023-10-02 LAB — RPR: RPR Ser Ql: NONREACTIVE

## 2023-10-02 MED ORDER — CYCLOBENZAPRINE HCL 5 MG PO TABS
5.0000 mg | ORAL_TABLET | Freq: Three times a day (TID) | ORAL | Status: DC | PRN
  Administered 2023-10-02 – 2023-10-03 (×2): 5 mg via ORAL
  Filled 2023-10-02 (×3): qty 1

## 2023-10-02 MED ORDER — DIPHENHYDRAMINE HCL 50 MG/ML IJ SOLN: 12.5000 mg | INTRAMUSCULAR | Status: DC | PRN

## 2023-10-02 MED ORDER — SIMETHICONE 80 MG PO CHEW: 80.0000 mg | CHEWABLE_TABLET | ORAL | Status: DC | PRN

## 2023-10-02 MED ORDER — EPHEDRINE 5 MG/ML INJ
10.0000 mg | INTRAVENOUS | Status: DC | PRN
  Filled 2023-10-02: qty 5

## 2023-10-02 MED ORDER — ONDANSETRON HCL 4 MG/2ML IJ SOLN
4.0000 mg | Freq: Four times a day (QID) | INTRAMUSCULAR | Status: DC | PRN
  Administered 2023-10-02: 4 mg via INTRAVENOUS
  Filled 2023-10-02: qty 2

## 2023-10-02 MED ORDER — DIBUCAINE (PERIANAL) 1 % EX OINT: 1.0000 | TOPICAL_OINTMENT | CUTANEOUS | Status: DC | PRN

## 2023-10-02 MED ORDER — COCONUT OIL OIL: 1.0000 | TOPICAL_OIL | Status: DC | PRN

## 2023-10-02 MED ORDER — IBUPROFEN 600 MG PO TABS
600.0000 mg | ORAL_TABLET | Freq: Four times a day (QID) | ORAL | Status: DC
  Administered 2023-10-02 – 2023-10-03 (×5): 600 mg via ORAL
  Filled 2023-10-02 (×6): qty 1

## 2023-10-02 MED ORDER — ACETAMINOPHEN 325 MG PO TABS
650.0000 mg | ORAL_TABLET | ORAL | Status: DC | PRN
  Administered 2023-10-02 – 2023-10-03 (×4): 650 mg via ORAL
  Filled 2023-10-02 (×4): qty 2

## 2023-10-02 MED ORDER — BENZOCAINE-MENTHOL 20-0.5 % EX AERO: 1.0000 | INHALATION_SPRAY | CUTANEOUS | Status: DC | PRN

## 2023-10-02 MED ORDER — ACETAMINOPHEN 325 MG PO TABS: 650.0000 mg | ORAL_TABLET | ORAL | Status: DC | PRN

## 2023-10-02 MED ORDER — PHENYLEPHRINE 80 MCG/ML (10ML) SYRINGE FOR IV PUSH (FOR BLOOD PRESSURE SUPPORT): 80.0000 ug | PREFILLED_SYRINGE | INTRAVENOUS | Status: DC | PRN

## 2023-10-02 MED ORDER — DIPHENHYDRAMINE HCL 25 MG PO CAPS: 25.0000 mg | ORAL_CAPSULE | Freq: Four times a day (QID) | ORAL | Status: DC | PRN

## 2023-10-02 MED ORDER — LACTATED RINGERS IV SOLN
500.0000 mL | Freq: Once | INTRAVENOUS | Status: AC
  Administered 2023-10-02: 500 mL via INTRAVENOUS

## 2023-10-02 MED ORDER — ONDANSETRON HCL 4 MG PO TABS: 4.0000 mg | ORAL_TABLET | ORAL | Status: DC | PRN

## 2023-10-02 MED ORDER — LACTATED RINGERS IV SOLN
500.0000 mL | INTRAVENOUS | Status: DC | PRN
  Administered 2023-10-02: 500 mL via INTRAVENOUS

## 2023-10-02 MED ORDER — OXYTOCIN BOLUS FROM INFUSION
333.0000 mL | Freq: Once | INTRAVENOUS | Status: AC
  Administered 2023-10-02: 333 mL via INTRAVENOUS

## 2023-10-02 MED ORDER — EPHEDRINE 5 MG/ML INJ: 10.0000 mg | INTRAVENOUS | Status: DC | PRN

## 2023-10-02 MED ORDER — PHENYLEPHRINE 80 MCG/ML (10ML) SYRINGE FOR IV PUSH (FOR BLOOD PRESSURE SUPPORT)
80.0000 ug | PREFILLED_SYRINGE | INTRAVENOUS | Status: DC | PRN
  Administered 2023-10-02 (×2): 80 ug via INTRAVENOUS
  Filled 2023-10-02: qty 10

## 2023-10-02 MED ORDER — WITCH HAZEL-GLYCERIN EX PADS: 1.0000 | MEDICATED_PAD | CUTANEOUS | Status: DC | PRN

## 2023-10-02 MED ORDER — ZOLPIDEM TARTRATE 5 MG PO TABS: 5.0000 mg | ORAL_TABLET | Freq: Every evening | ORAL | Status: DC | PRN

## 2023-10-02 MED ORDER — ONDANSETRON HCL 4 MG/2ML IJ SOLN: 4.0000 mg | INTRAMUSCULAR | Status: DC | PRN

## 2023-10-02 MED ORDER — TERBUTALINE SULFATE 1 MG/ML IJ SOLN: 0.2500 mg | Freq: Once | INTRAMUSCULAR | Status: DC | PRN

## 2023-10-02 MED ORDER — LIDOCAINE HCL (PF) 1 % IJ SOLN
INTRAMUSCULAR | Status: DC | PRN
  Administered 2023-10-02 (×2): 3 mL via EPIDURAL

## 2023-10-02 MED ORDER — FLEET ENEMA RE ENEM: 1.0000 | ENEMA | RECTAL | Status: DC | PRN

## 2023-10-02 MED ORDER — FENTANYL-BUPIVACAINE-NACL 0.5-0.125-0.9 MG/250ML-% EP SOLN
12.0000 mL/h | EPIDURAL | Status: DC | PRN
  Administered 2023-10-02: 12 mL/h via EPIDURAL
  Filled 2023-10-02: qty 250

## 2023-10-02 MED ORDER — SOD CITRATE-CITRIC ACID 500-334 MG/5ML PO SOLN: 30.0000 mL | ORAL | Status: DC | PRN

## 2023-10-02 MED ORDER — OXYTOCIN-SODIUM CHLORIDE 30-0.9 UT/500ML-% IV SOLN
1.0000 m[IU]/min | INTRAVENOUS | Status: DC
  Administered 2023-10-02: 2 m[IU]/min via INTRAVENOUS

## 2023-10-02 MED ORDER — LIDOCAINE HCL (PF) 1 % IJ SOLN: 30.0000 mL | INTRAMUSCULAR | Status: DC | PRN

## 2023-10-02 MED ORDER — SENNOSIDES-DOCUSATE SODIUM 8.6-50 MG PO TABS
2.0000 | ORAL_TABLET | Freq: Every day | ORAL | Status: DC
  Administered 2023-10-03: 2 via ORAL
  Filled 2023-10-02: qty 2

## 2023-10-02 MED ORDER — OXYTOCIN-SODIUM CHLORIDE 30-0.9 UT/500ML-% IV SOLN
2.5000 [IU]/h | INTRAVENOUS | Status: DC
  Filled 2023-10-02: qty 500

## 2023-10-02 MED ORDER — OXYCODONE-ACETAMINOPHEN 5-325 MG PO TABS: 2.0000 | ORAL_TABLET | ORAL | Status: DC | PRN

## 2023-10-02 MED ORDER — OXYCODONE-ACETAMINOPHEN 5-325 MG PO TABS: 1.0000 | ORAL_TABLET | ORAL | Status: DC | PRN

## 2023-10-02 MED ORDER — PRENATAL MULTIVITAMIN CH
1.0000 | ORAL_TABLET | Freq: Every day | ORAL | Status: DC
  Filled 2023-10-02 (×2): qty 1

## 2023-10-02 MED ORDER — TETANUS-DIPHTH-ACELL PERTUSSIS 5-2.5-18.5 LF-MCG/0.5 IM SUSY: 0.5000 mL | PREFILLED_SYRINGE | Freq: Once | INTRAMUSCULAR | Status: DC

## 2023-10-02 MED ORDER — LACTATED RINGERS IV SOLN: INTRAVENOUS | Status: DC

## 2023-10-02 NOTE — Lactation Note (Signed)
 This note was copied from a baby's chart. Lactation Consultation Note  Patient Name: Debra Stafford [id number] Date: 10/02/2023 Age:26 hours Reason for consult: Initial assessment  Per MBU RN, MOB reports that she does not want to see the lactation team while in the hospital. MOB knows she can request assistance at anytime.   Feeding Mother's Current Feeding Choice: Breast Milk Nipple Type: Slow - flow  Consult Status Consult Status: Complete (mother declined follow up) Date: 10/02/23    Vernette Goo BS, IBCLC 10/02/2023, 2:26 PM

## 2023-10-02 NOTE — Progress Notes (Signed)
 Baseline 135, TOCO q72min. Variable decels with each contraction. CE to place IUPC however found to be complete/0 station. Initiate pitocin  2x2 and recheck to hopefully begin pushing in BP 98/69   Pulse 95   Temp 98.2 F (36.8 C) (Oral)   Resp 17   Ht 5\' 6"  (1.676 m)   Wt 70.7 kg   LMP 07/19/2022 (Exact Date)   SpO2 99%   BMI 25.16 kg/m

## 2023-10-02 NOTE — H&P (Signed)
 Debra Stafford is a 28 y.o. female presenting for evaluation of contractions. 4cm in office yesterday, now 5-6 with q4ctx in MAU. +FM, denies VB.   PNC c/b 1) Anemia - Hgb 9.9, s/p IV iron  on 3/21, 3/31, rpt CBC pending 2) Migraine - Nl MRI - dizziness attributed to low BP and anemia , complicated migraine. EEG done  Start mg oxide and riboflavin 400mg  daily. F/U pp planned Short interval preg - delivered 03/2022  GBS neg  OB History     Gravida  4   Para  1   Term  1   Preterm  0   AB  2   Living  1      SAB  2   IAB  0   Ectopic  0   Multiple  0   Live Births  1          Past Medical History:  Diagnosis Date   Anemia    Anxiety    Headache    UTI (urinary tract infection)    Vasovagal episode    Past Surgical History:  Procedure Laterality Date   NO PAST SURGERIES     Family History: family history includes Aneurysm in her father; Asthma in her father; Depression in her mother; Diabetes in her mother; High Cholesterol in her father; Hypertension in her father. Social History:  reports that she has never smoked. She has never used smokeless tobacco. She reports that she does not currently use alcohol. She reports that she does not use drugs.     Maternal Diabetes: No1hr 20 Genetic Screening: Normal Maternal Ultrasounds/Referrals: Normal Fetal Ultrasounds or other Referrals:  None Maternal Substance Abuse:  No Significant Maternal Medications:  None Significant Maternal Lab Results:  Group B Strep negative Number of Prenatal Visits:greater than 3 verified prenatal visits Maternal Vaccinations:TDap Other Comments:  None  Review of Systems  Constitutional:  Negative for chills and fever.  Respiratory:  Negative for shortness of breath.   Cardiovascular:  Negative for chest pain, palpitations and leg swelling.  Gastrointestinal:  Negative for abdominal pain, nausea and vomiting.  Neurological:  Negative for dizziness, weakness and headaches.   Psychiatric/Behavioral:  Negative for suicidal ideas.    Maternal Medical History:  Reason for admission: Contractions.  Nausea.  Contractions: Onset was 3-5 hours ago.   Frequency: regular.   Perceived severity is strong.   Fetal activity: Perceived fetal activity is normal.   Prenatal complications: No bleeding or PIH.   Prenatal Complications - Diabetes: none.   Dilation: 5 Effacement (%): 90 Station: -2 Exam by:: [employer], RN Blood pressure 94/65, pulse 83, temperature (!) 97.3 F (36.3 C), temperature source Oral, resp. rate 18, height 5\' 6"  (1.676 m), weight 70.7 kg, last menstrual period 07/19/2022, not currently breastfeeding. Exam Physical Exam Constitutional:      General: She is not in acute distress.    Appearance: She is well-developed.  HENT:     Head: Normocephalic and atraumatic.  Eyes:     Pupils: Pupils are equal, round, and reactive to light.  Cardiovascular:     Rate and Rhythm: Normal rate and regular rhythm.     Heart sounds: No murmur heard.    No gallop.  Abdominal:     Tenderness: There is no abdominal tenderness. There is no guarding or rebound.  Genitourinary:    Vagina: Normal.  Musculoskeletal:        General: Normal range of motion.     Cervical back:  Normal range of motion and neck supple.  Skin:    General: Skin is warm and dry.  Neurological:     Mental Status: She is alert and oriented to person, place, and time.     Prenatal labs: ABO, Rh: --/--/PENDING (05/15 0318)Opos Antibody: PENDING (05/15 0318)neg Rubella:  imm RPR:   nr HBsAg:   neg HIV:   nr GBS:   neg  Cat 1 tracing TOCO q69min  Assessment/Plan: This is a 27yo [id number] @ 37 4/7 by LMP c/w 10wk scan admitted in active labor, CE 5-6cm. Epidural then AROM, GBS neg. Pelvis proven to 7lb3oz. Of note, VAVD for FHR decels. Anticipate SVD  Martin Slay Debra Stafford 10/02/2023, 4:15 AM

## 2023-10-02 NOTE — MAU Note (Signed)
.  MAU Labor Triage Note: .Debra Stafford is a 28 y.o. at [redacted]w[redacted]d here in MAU reporting:  Contractions every: 3 minutes Onset of ctx: today     ROM: intact Vaginal Bleeding: bloody show Last SVE: 4 Labor Pain Management Plan: Planning epidural  Fetal Movement: last movement at 2300 FHT:    Vitals:   10/02/23 0247  BP: 98/65  Pulse: 84  Resp: 17  Temp: (!) 97.3 F (36.3 C)     OB Office: [place] GBS: Negative Lab orders placed from triage: MAU Labor Eval

## 2023-10-02 NOTE — Progress Notes (Signed)
 Post Partum Day 0  Subjective: Pain 7/10, receiving medication. Lochia normal. Ambulating well, was able to shower  Objective: Blood pressure 92/63, pulse 68, temperature 97.6 F (36.4 C), resp. rate 18, height 5\' 6"  (1.676 m), weight 70.7 kg, last menstrual period 07/19/2022, SpO2 100%, unknown if currently breastfeeding.  Physical Exam:  General: alert and cooperative  Recent Labs    10/02/23 0318  HGB 12.0  HCT 36.7    Assessment/Plan: 28 yo G2 now P2 PPD0 s/p NSVD this AM following labor  - PP: Continue routine care, will monitor pain following meds given. Discussed additional treatment if needed   LOS: 0 days   Leanne Pronto, DO 10/02/2023, 11:41 AM

## 2023-10-02 NOTE — Anesthesia Postprocedure Evaluation (Signed)
 Anesthesia Post Note  Patient: Debra Stafford  Procedure(s) Performed: AN AD HOC LABOR EPIDURAL     Patient location during evaluation: Mother Baby Anesthesia Type: Epidural Level of consciousness: awake and alert Pain management: pain level controlled Vital Signs Assessment: post-procedure vital signs reviewed and stable Respiratory status: spontaneous breathing, nonlabored ventilation and respiratory function stable Cardiovascular status: stable Postop Assessment: no headache, no backache and epidural receding Anesthetic complications: no   No notable events documented.  Last Vitals:  Vitals:   10/02/23 0801 10/02/23 0815  BP: (!) 92/53 (!) 89/58  Pulse: 70 80  Resp: 18   Temp:    SpO2:      Last Pain:  Vitals:   10/02/23 0815  TempSrc:   PainSc: 0-No pain   Pain Goal:                   [name]

## 2023-10-02 NOTE — Anesthesia Procedure Notes (Signed)
 Epidural Patient location during procedure: OB Start time: 10/02/2023 4:20 AM End time: 10/02/2023 4:37 AM  Staffing Anesthesiologist: Rosalita Combe, MD Performed: anesthesiologist   Preanesthetic Checklist Completed: patient identified, IV checked, site marked, risks and benefits discussed, surgical consent, monitors and equipment checked, pre-op evaluation and timeout performed  Epidural Patient position: sitting Prep: DuraPrep and site prepped and draped Patient monitoring: continuous pulse ox and blood pressure Approach: midline Location: L3-L4 Injection technique: LOR air  Needle:  Needle type: Tuohy  Needle gauge: 17 G Needle length: 9 cm and 9 Needle insertion depth: 5 cm cm Catheter type: closed end flexible Catheter size: 19 Gauge Catheter at skin depth: 10 cm Test dose: negative  Assessment Events: blood not aspirated, no cerebrospinal fluid, injection not painful, no injection resistance, no paresthesia and negative IV test  Additional Notes Patient identified. Risks/Benefits/Options discussed with patient including but not limited to bleeding, infection, nerve damage, paralysis, failed block, incomplete pain control, headache, blood pressure changes, nausea, vomiting, reactions to medication both or allergic, itching and postpartum back pain. Confirmed with bedside nurse the patient's most recent platelet count. Confirmed with patient that they are not currently taking any anticoagulation, have any bleeding history or any family history of bleeding disorders. Patient expressed understanding and wished to proceed. All questions were answered. Sterile technique was used throughout the entire procedure. Please see nursing notes for vital signs. Test dose was given through epidural needle and negative prior to continuing to dose epidural or start infusion. Warning signs of high block given to the patient including shortness of breath, tingling/numbness in hands, complete  motor block, or any concerning symptoms with instructions to call for help. Patient was given instructions on fall risk and not to get out of bed. All questions and concerns addressed with instructions to call with any issues.  1Attempt (S) . Patient tolerated procedure well.

## 2023-10-02 NOTE — Anesthesia Preprocedure Evaluation (Signed)
 Anesthesia Evaluation  Patient identified by MRN, date of birth, ID band Patient awake    Reviewed: Allergy & Precautions, Patient's Chart, lab work & pertinent test results  Airway Mallampati: II  TM Distance: >3 FB Neck ROM: Full    Dental no notable dental hx. (+) Teeth Intact, Dental Advisory Given   Pulmonary neg pulmonary ROS   Pulmonary exam normal breath sounds clear to auscultation       Cardiovascular Normal cardiovascular exam Rhythm:Regular Rate:Normal     Neuro/Psych  Headaches  Anxiety        GI/Hepatic negative GI ROS, Neg liver ROS,,,  Endo/Other  negative endocrine ROS    Renal/GU negative Renal ROS     Musculoskeletal   Abdominal   Peds  Hematology Lab Results      Component                Value               Date                      WBC                      13.1 (H)            10/02/2023                HGB                      12.0                10/02/2023                HCT                      36.7                10/02/2023                MCV                      88.4                10/02/2023                PLT                      178                 10/02/2023              Anesthesia Other Findings ALL: Latex, Sulfa, codeine  Reproductive/Obstetrics (+) Pregnancy                             Anesthesia Physical Anesthesia Plan  ASA: 2  Anesthesia Plan: Epidural   Post-op Pain Management:    Induction:   PONV Risk Score and Plan:   Airway Management Planned:   Additional Equipment:   Intra-op Plan:   Post-operative Plan:   Informed Consent: I have reviewed the patients History and Physical, chart, labs and discussed the procedure including the risks, benefits and alternatives for the proposed anesthesia with the patient or authorized representative who has indicated his/her understanding and acceptance.       Plan Discussed with:   Anesthesia  Plan Comments: (37.4 wk G2P1  for LEA)       Anesthesia Quick Evaluation

## 2023-10-02 NOTE — Progress Notes (Signed)
 Comfortable s/p epidural BP 101/64   Pulse 70   Temp (!) 97.3 F (36.3 C) (Oral)   Resp 18   Ht 5\' 6"  (1.676 m)   Wt 70.7 kg   LMP 07/19/2022 (Exact Date)   SpO2 100%   BMI 25.16 kg/m  Clear AROM @ 0455, CE 5-6/80/-2  Cat 1 tracing, early decels. TOCO q2-83min  Augment with pitocin  if space out over next hour. Anticipate SVD

## 2023-10-03 LAB — CBC
HCT: 32 % — ABNORMAL LOW (ref 36.0–46.0)
Hemoglobin: 10.8 g/dL — ABNORMAL LOW (ref 12.0–15.0)
MCH: 30 pg (ref 26.0–34.0)
MCHC: 33.8 g/dL (ref 30.0–36.0)
MCV: 88.9 fL (ref 80.0–100.0)
Platelets: 174 10*3/uL (ref 150–400)
RBC: 3.6 MIL/uL — ABNORMAL LOW (ref 3.87–5.11)
RDW: 19.8 % — ABNORMAL HIGH (ref 11.5–15.5)
WBC: 11.3 10*3/uL — ABNORMAL HIGH (ref 4.0–10.5)
nRBC: 0 % (ref 0.0–0.2)

## 2023-10-03 MED ORDER — IBUPROFEN 600 MG PO TABS: 600.0000 mg | ORAL_TABLET | Freq: Four times a day (QID) | ORAL | Status: DC

## 2023-10-03 MED ORDER — ACETAMINOPHEN 325 MG PO TABS: 650.0000 mg | ORAL_TABLET | ORAL | Status: AC | PRN

## 2023-10-03 MED ORDER — POLYETHYLENE GLYCOL 3350 17 G PO PACK
17.0000 g | PACK | Freq: Every day | ORAL | Status: DC
  Administered 2023-10-03: 17 g via ORAL
  Filled 2023-10-03 (×2): qty 1

## 2023-10-03 NOTE — Progress Notes (Signed)
 Post Partum Day 1  Subjective: Doing well. Denies any pain at this time. Ambulating, voiding, tolerating PO without issue. Lochia appropriate. Desires miralax for BM. Denies lightheadedness. Desires discharge after circumcision  Objective:    10/03/2023    3:21 PM 10/03/2023    7:32 AM 10/03/2023    7:18 AM  Vitals with BMI  Systolic 100 93 75  Diastolic 69 64 49  Pulse 85 62 61    Physical Exam:  General: no acute distress Pulm: normal work of breathing on room air Card: well perfused, no edema or signs of DVT Abd: soft, non-tender, fundus firm below umbilicus MSK: normal ROM Neuro: no focal deficits, oriented x3 Psych: normal mood, normal thought   Recent Labs    10/02/23 0318 10/03/23 0508  HGB 12.0 10.8*  HCT 36.7 32.0*    Assessment/Plan: 28 yo G2 now P2 PPD1 s/p NSVD after presenting in labor  -PPD1: Continue routine care -borderline hypotension: chronic, asymptomatic  -desires circumcision: Parents desire neonatal circumcision, R/B/A of procedure discussed at length. Parents understand that neonatal circumcision is not considered medically necessary and is elective. The risks include, but are not limited to bleeding, infection, damage to the penis, development of scar tissue, and having to have it redone at a later date. Parents understand these risks and wish to proceed  Dispo: Anticipate discharge today.   LOS: 1 day   Alene Ana, MD 10/03/2023, 3:25 PM

## 2023-10-03 NOTE — Discharge Summary (Signed)
 Postpartum Discharge Summary  Date of Service updated 5/15-5/16/25     Patient Name: Debra Stafford DOB: 14-Dec-1995 MRN: [id number]  Date of admission: 10/02/2023 Delivery date:10/02/2023 Delivering provider: Gaspar Karma Date of discharge: 10/03/2023  Admitting diagnosis: Normal labor [O80, Z37.9] Intrauterine pregnancy: [redacted]w[redacted]d     Secondary diagnosis:  Principal Problem:   Normal labor  Additional problems: none    Discharge diagnosis: Term Pregnancy Delivered                                              Post partum procedures:none Augmentation: AROM and Pitocin  Complications: None  Hospital course: Onset of Labor With Vaginal Delivery      28 y.o. yo [id number] at [redacted]w[redacted]d was admitted in Active Labor on 10/02/2023. Labor course was complicated by nothing  Membrane Rupture Time/Date: 4:53 AM,10/02/2023  Delivery Method:Vaginal, Spontaneous Episiotomy: None Lacerations:  None Patient had a postpartum course complicated by nothing.  She is ambulating, tolerating a regular diet, passing flatus, and urinating well. Patient is discharged home in stable condition on 10/03/23.  Newborn Data: Birth date:10/02/2023 Birth time:7:05 AM Gender:Female Living status:Living Apgars:8 ,9  Weight:3150 g  Magnesium  Sulfate received: No BMZ received: No Rhophylac:No MMR:No T-DaP:Given prenatally Transfusion:No Immunizations administered: There is no immunization history for the selected administration types on file for this patient.  Physical exam  Vitals:   10/02/23 2230 10/03/23 0718 10/03/23 0732 10/03/23 1521  BP: (!) 93/54 (!) 75/49 93/64 100/69  Pulse: 70 61 62 85  Resp: 18 18  16   Temp: 98 F (36.7 C) 97.6 F (36.4 C)  97.9 F (36.6 C)  TempSrc: Oral Oral  Oral  SpO2: 100% 99%  100%  Weight:      Height:       General: alert, cooperative, and no distress Lochia: appropriate Uterine Fundus: firm DVT Evaluation: No evidence of DVT seen on physical  exam. Labs: Lab Results  Component Value Date   WBC 11.3 (H) 10/03/2023   HGB 10.8 (L) 10/03/2023   HCT 32.0 (L) 10/03/2023   MCV 88.9 10/03/2023   PLT 174 10/03/2023      Latest Ref Rng & Units 07/06/2023    8:34 PM  CMP  Glucose 70 - 99 mg/dL 71   BUN 6 - 20 mg/dL 10   Creatinine 5.78 - 1.00 mg/dL 4.69   Sodium 629 - 528 mmol/L 137   Potassium 3.5 - 5.1 mmol/L 3.5   Chloride 98 - 111 mmol/L 105   CO2 22 - 32 mmol/L 20   Calcium  8.9 - 10.3 mg/dL 8.9   Total Protein 6.5 - 8.1 g/dL 6.5   Total Bilirubin 0.0 - 1.2 mg/dL 0.3   Alkaline Phos 38 - 126 U/L 73   AST 15 - 41 U/L 14   ALT 0 - 44 U/L 9    Edinburgh Score:    10/02/2023   11:33 PM  Edinburgh Postnatal Depression Scale Screening Tool  I have been able to laugh and see the funny side of things. 0  I have looked forward with enjoyment to things. 0  I have blamed myself unnecessarily when things went wrong. 0  I have been anxious or worried for no good reason. 0  I have felt scared or panicky for no good reason. 0  Things have been getting on top of me.  0  I have been so unhappy that I have had difficulty sleeping. 0  I have felt sad or miserable. 0  I have been so unhappy that I have been crying. 0  The thought of harming myself has occurred to me. 0  Edinburgh Postnatal Depression Scale Total 0      After visit meds:  Allergies as of 10/03/2023       Reactions   Codeine Anaphylaxis   Hives, fever, throat closes   Latex Hives, Swelling   Sulfamethoxazole-trimethoprim Hives, Rash   Fever 105F, bleeding        Medication List     STOP taking these medications    ferrous sulfate  325 (65 FE) MG tablet   Misc. Devices Misc       TAKE these medications    acetaminophen  325 MG tablet Commonly known as: Tylenol  Take 2 tablets (650 mg total) by mouth every 4 (four) hours as needed (for pain scale < 4).   cyclobenzaprine  5 MG tablet Commonly known as: FLEXERIL  Take 1 tablet (5 mg total) by mouth  3 (three) times daily as needed for muscle spasms. MAY CAUSE DROWSINESS, DO NOT OPERATE HEAVY MACHINERY AFTER TAKING THIS MEDICATION   ibuprofen  600 MG tablet Commonly known as: ADVIL  Take 1 tablet (600 mg total) by mouth every 6 (six) hours.   prenatal multivitamin Tabs tablet Take 1 tablet by mouth daily at 12 noon.         Discharge home in stable condition Infant Feeding: Breast Infant Disposition:home with mother Discharge instruction: per After Visit Summary and Postpartum booklet. Activity: Advance as tolerated. Pelvic rest for 6 weeks.  Diet: routine diet Anticipated Birth Control: Unsure Postpartum Appointment:6 weeks Additional Postpartum F/U: routine Future Appointments:No future appointments. Follow up Visit:  Follow-up Information     [facility]. Schedule an appointment as soon as possible for a visit in 6 week(s).   Contact information: [address]  [address] [place] [address] [phone]                     10/03/2023 Alene Ana, MD

## 2023-10-06 NOTE — Addendum Note (Signed)
 Addendum  created 10/06/23 1043 by Rhenda Cedars, MD   Intraprocedure Staff edited

## 2023-10-12 ENCOUNTER — Inpatient Hospital Stay (HOSPITAL_COMMUNITY)
Admission: AD | Admit: 2023-10-12 | Discharge: 2023-10-12 | Disposition: A | Discharge status: Home / Self Care | Attending: Obstetrics and Gynecology | Admitting: Obstetrics and Gynecology

## 2023-10-12 ENCOUNTER — Other Ambulatory Visit: Payer: Self-pay

## 2023-10-12 DIAGNOSIS — O9089 Other complications of the puerperium, not elsewhere classified: Secondary | ICD-10-CM | POA: Insufficient documentation

## 2023-10-12 DIAGNOSIS — R509 Fever, unspecified: Secondary | ICD-10-CM | POA: Diagnosis present

## 2023-10-12 DIAGNOSIS — O9122 Nonpurulent mastitis associated with the puerperium: Secondary | ICD-10-CM

## 2023-10-12 DIAGNOSIS — N61 Mastitis without abscess: Secondary | ICD-10-CM | POA: Insufficient documentation

## 2023-10-12 LAB — URINALYSIS, ROUTINE W REFLEX MICROSCOPIC
Bilirubin Urine: NEGATIVE
Glucose, UA: NEGATIVE mg/dL
Ketones, ur: NEGATIVE mg/dL
Nitrite: NEGATIVE
Protein, ur: NEGATIVE mg/dL
RBC / HPF: >50 RBC/hpf (ref 0–5)
Specific Gravity, Urine: 1.013 (ref 1.005–1.030)
pH: 7 (ref 5.0–8.0)

## 2023-10-12 NOTE — MAU Provider Note (Cosign Needed Addendum)
 History     CSN: [id number]  Arrival date and time: 10/12/23 1031   Event Date/Time   First Provider Initiated Contact with Patient 10/12/23 1131      Chief Complaint  Patient presents with   Fever   Ms. Debra Stafford is a 28 y.o. year old G4P2022 female 10 days postpartum presenting to MAU reporting fever and chills. She reports that it started last night around midnight when she felt chills and fatigue. When she checked her temperature this morning it was 101.2. She has not taken anything for the fever.  She has noticed that she is more tender under her right breast. Her newborn is latching occasionally to the breast, but she is mostly pumping with her portable breast pumps for 25 minutes. She denies any HA, body aches, burning with urinating, vaginal odor, or recent exposure to any known illness.    Fever     OB History     Gravida  4   Para  2   Term  2   Preterm  0   AB  2   Living  2      SAB  2   IAB  0   Ectopic  0   Multiple  0   Live Births  2           Past Medical History:  Diagnosis Date   Anemia    Anxiety    Headache    UTI (urinary tract infection)    Vasovagal episode     Past Surgical History:  Procedure Laterality Date   NO PAST SURGERIES      Family History  Problem Relation Age of Onset   Depression Mother    Diabetes Mother    Aneurysm Father    Asthma Father    Hypertension Father    High Cholesterol Father     Social History   Tobacco Use   Smoking status: Never   Smokeless tobacco: Never  Vaping Use   Vaping status: Never Used  Substance Use Topics   Alcohol use: Not Currently    Comment: occ   Drug use: No    Allergies:  Allergies  Allergen Reactions   Codeine Anaphylaxis    Hives, fever, throat closes   Latex Hives and Swelling   Sulfamethoxazole-Trimethoprim Hives and Rash    Fever 105F, bleeding     No medications prior to admission.    Review of Systems  Constitutional:   Positive for fever.  Pertinent items noted in HPI and remainder of comprehensive ROS otherwise negative.  Physical Exam   Blood pressure 95/64, pulse 98, temperature 98.7 F (37.1 C), temperature source Oral, resp. rate 16, weight 65.5 kg, last menstrual period 07/19/2022, SpO2 98%, currently breastfeeding.  Physical Exam HENT:     Head: Normocephalic.  Cardiovascular:     Rate and Rhythm: Normal rate.  Pulmonary:     Effort: Pulmonary effort is normal.  Chest:  Breasts:    Right: Tenderness present.     Left: Normal.     Comments: Tender under the right breast, pink, no dimpling, induration, or streaks. Skin:    General: Skin is warm.  Neurological:     Mental Status: She is alert. Mental status is at baseline.     MAU Course  Procedures  MDM Results for orders placed or performed during the hospital encounter of 10/12/23 (from the past 24 hours)  Urinalysis, Routine w reflex microscopic -Urine, Clean Catch  Status: Abnormal   Collection Time: 10/12/23 10:41 AM  Result Value Ref Range   Color, Urine YELLOW YELLOW   APPearance HAZY (A) CLEAR   Specific Gravity, Urine 1.013 1.005 - 1.030   pH 7.0 5.0 - 8.0   Glucose, UA NEGATIVE NEGATIVE mg/dL   Hgb urine dipstick LARGE (A) NEGATIVE   Bilirubin Urine NEGATIVE NEGATIVE   Ketones, ur NEGATIVE NEGATIVE mg/dL   Protein, ur NEGATIVE NEGATIVE mg/dL   Nitrite NEGATIVE NEGATIVE   Leukocytes,Ua LARGE (A) NEGATIVE   RBC / HPF >50 0 - 5 RBC/hpf   WBC, UA 21-50 0 - 5 WBC/hpf   Bacteria, UA RARE (A) NONE SEEN   Squamous Epithelial / HPF 0-5 0 - 5 /HPF    No other sources of infection or acute findings warranting further workup.  Assessment and Plan  1. Mastitis associated with childbirth, delivered (Primary) - Likely an early case of mastitis. Reassured by physical exam and VS WNL  - Encouraged patient to try conservative management including:  1. Ice application  2. Ibuprofen  or Acetaminophen  for transient fever and  inflammation 3. Massage and hand express to empty breasts. 4. Be sure to empty breasts every 2-3 hours.  - Return to MAU if symptoms worsen. May also follow up with a lactation consultant or the OB office. Patient is comfortable with this plan.   Wilford Hanks 10/12/2023, 12:12 PM   Midwife Attestation:  I personally saw and evaluated the patient, performing the key elements of the service. I developed and verified the management plan that is described in the resident's/student's note, and I agree with the content with my edits above. VSS, HRR&R, Resp unlabored, Legs neg.    Arlester Bence, CNM 1:41 PM

## 2023-10-12 NOTE — MAU Note (Signed)
.  Debra Stafford is a 28 y.o. at [redacted]w[redacted]d here in MAU reporting: she did not feel good last night had chills and took her temperature with a thermometer and it was 101.2 was otld to come in. That was at 0900  Onset of complaint: yesterday  There were no vitals filed for this visit.    Lab orders placed from triage:   ua

## 2023-10-15 ENCOUNTER — Telehealth (HOSPITAL_COMMUNITY): Payer: Self-pay

## 2023-10-15 NOTE — Telephone Encounter (Signed)
 10/15/2023 1211  Name: Debra Stafford MRN: [id number] DOB: Feb 21, 1996  Reason for Call:  Transition of Care Hospital Discharge Call  Contact Status: Patient Contact Status: Complete  Language assistant needed:          Follow-Up Questions: Do You Have Any Concerns About Your Health As You Heal From Delivery?: No Do You Have Any Concerns About Your Infants Health?: No  Edinburgh Postnatal Depression Scale:  In the Past 7 Days:    PHQ2-9 Depression Scale:     Discharge Follow-up: Edinburgh score requires follow up?:  (Patient declines EPDS at this time. Patient states that she is doing fine emotionally. RN told patient to reach out to her OB-GYN should any concerns arise.) Patient was advised of the following resources:: Breastfeeding Support Group, Support Group  Post-discharge interventions: Reviewed Newborn Safe Sleep Practices  Signature  Wadell Guild

## 2023-10-26 ENCOUNTER — Inpatient Hospital Stay (HOSPITAL_COMMUNITY)

## 2023-10-26 ENCOUNTER — Inpatient Hospital Stay (HOSPITAL_COMMUNITY): Admission: RE | Admit: 2023-10-26 | Source: Home / Self Care | Admitting: Obstetrics and Gynecology

## 2023-12-03 ENCOUNTER — Encounter (HOSPITAL_BASED_OUTPATIENT_CLINIC_OR_DEPARTMENT_OTHER): Payer: Self-pay | Admitting: Radiology

## 2023-12-03 ENCOUNTER — Emergency Department (HOSPITAL_BASED_OUTPATIENT_CLINIC_OR_DEPARTMENT_OTHER)
Admission: EM | Admit: 2023-12-03 | Discharge: 2023-12-03 | Disposition: A | Discharge status: Home / Self Care | Attending: Emergency Medicine | Admitting: Emergency Medicine

## 2023-12-03 ENCOUNTER — Other Ambulatory Visit: Payer: Self-pay

## 2023-12-03 DIAGNOSIS — X58XXXA Exposure to other specified factors, initial encounter: Secondary | ICD-10-CM | POA: Insufficient documentation

## 2023-12-03 DIAGNOSIS — S3141XA Laceration without foreign body of vagina and vulva, initial encounter: Secondary | ICD-10-CM | POA: Insufficient documentation

## 2023-12-03 DIAGNOSIS — S3993XA Unspecified injury of pelvis, initial encounter: Secondary | ICD-10-CM | POA: Diagnosis present

## 2023-12-03 NOTE — Discharge Instructions (Signed)
 Return to the emergency department for pus draining from the wound, increased pain, increased swelling, or for other new and concerning symptoms.

## 2023-12-03 NOTE — ED Provider Notes (Signed)
  [facility] Provider Note   CSN: [id number] Arrival date & time: 12/03/23  0145     Patient presents with: Vaginal tear   Nattalie Santiesteban is a 28 y.o. female.   Patient is a 28 year old female presenting with complaints of a vaginal laceration.  She was having sex with her fianc when she felt a tear to the right side of her vagina.  She then began bleeding.  She looked in the mirror and noticed a laceration and presents for evaluation of this.  Bleeding has since stopped at the time of my evaluation.       Prior to Admission medications   Medication Sig Start Date End Date Taking? Authorizing Provider  acetaminophen  (TYLENOL ) 325 MG tablet Take 2 tablets (650 mg total) by mouth every 4 (four) hours as needed (for pain scale < 4). 10/03/23   Claire Rubie LABOR, MD  cyclobenzaprine  (FLEXERIL ) 5 MG tablet Take 1 tablet (5 mg total) by mouth 3 (three) times daily as needed for muscle spasms. MAY CAUSE DROWSINESS, DO NOT OPERATE HEAVY MACHINERY AFTER TAKING THIS MEDICATION 09/30/23   Von Reasoner, MD  ibuprofen  (ADVIL ) 600 MG tablet Take 1 tablet (600 mg total) by mouth every 6 (six) hours. 10/03/23   Claire Rubie LABOR, MD  Prenatal Vit-Fe Fumarate-FA (PRENATAL MULTIVITAMIN) TABS tablet Take 1 tablet by mouth daily at 12 noon.    [provider]    Allergies: Codeine, Latex, and Sulfamethoxazole-trimethoprim    Review of Systems  All other systems reviewed and are negative.   Updated Vital Signs BP 93/67   Pulse 68   Temp 97.7 F (36.5 C) (Oral)   Resp 16   Ht 5' 6 (1.676 m)   Wt 65.8 kg   LMP 07/19/2022 (Exact Date)   SpO2 100%   BMI 23.40 kg/m   Physical Exam Vitals and nursing note reviewed.  HENT:     Head: Normocephalic.  Pulmonary:     Effort: Pulmonary effort is normal.  Genitourinary:    Comments: Upon vaginal examination, there is a 1.5 cm laceration noted to the inside of the labia minora.  Bleeding  has resolved and wound appears well-approximated. Neurological:     Mental Status: She is alert and oriented to person, place, and time.     (all labs ordered are listed, but only abnormal results are displayed) Labs Reviewed - No data to display  EKG: None  Radiology: No results found.   Procedures   Medications Ordered in the ED - No data to display                                  Medical Decision Making  Bleeding has stopped and I see no indication for suturing.  I will allow this to heal on its own.  Patient advised to rest for the next 2 weeks to allow wound to heal.  To return as needed if she experiences any problems.     Final diagnoses:  None    ED Discharge Orders     None          Geroldine Berg, MD 12/03/23 [phone]

## 2023-12-03 NOTE — ED Triage Notes (Signed)
 States she was having intercourse and obtained a vaginal tear to the right side of her vagina. She states it was bleeding a lot and that she is still bleeding some. She is wearing a pad at this time.

## 2024-04-21 ENCOUNTER — Ambulatory Visit (HOSPITAL_BASED_OUTPATIENT_CLINIC_OR_DEPARTMENT_OTHER)
Admission: RE | Admit: 2024-04-21 | Discharge: 2024-04-21 | Disposition: A | Source: Ambulatory Visit | Attending: Student | Admitting: Radiology

## 2024-04-21 ENCOUNTER — Ambulatory Visit (HOSPITAL_BASED_OUTPATIENT_CLINIC_OR_DEPARTMENT_OTHER): Payer: Self-pay | Admitting: Student

## 2024-04-21 ENCOUNTER — Ambulatory Visit (INDEPENDENT_AMBULATORY_CARE_PROVIDER_SITE_OTHER): Admitting: Student

## 2024-04-21 ENCOUNTER — Encounter (HOSPITAL_BASED_OUTPATIENT_CLINIC_OR_DEPARTMENT_OTHER): Payer: Self-pay | Admitting: Student

## 2024-04-21 VITALS — BP 98/65 | HR 78 | Temp 98.3°F | Resp 16 | Ht 65.0 in | Wt 159.6 lb

## 2024-04-21 DIAGNOSIS — G43109 Migraine with aura, not intractable, without status migrainosus: Secondary | ICD-10-CM

## 2024-04-21 DIAGNOSIS — B379 Candidiasis, unspecified: Secondary | ICD-10-CM | POA: Diagnosis not present

## 2024-04-21 DIAGNOSIS — J0100 Acute maxillary sinusitis, unspecified: Secondary | ICD-10-CM | POA: Diagnosis not present

## 2024-04-21 DIAGNOSIS — R051 Acute cough: Secondary | ICD-10-CM

## 2024-04-21 DIAGNOSIS — J4520 Mild intermittent asthma, uncomplicated: Secondary | ICD-10-CM

## 2024-04-21 DIAGNOSIS — Z7689 Persons encountering health services in other specified circumstances: Secondary | ICD-10-CM

## 2024-04-21 MED ORDER — FLUCONAZOLE 150 MG PO TABS: 150.0000 mg | ORAL_TABLET | Freq: Once | ORAL | 0 refills | Status: AC

## 2024-04-21 MED ORDER — AMOXICILLIN-POT CLAVULANATE 875-125 MG PO TABS: 1.0000 | ORAL_TABLET | Freq: Two times a day (BID) | ORAL | 0 refills | Status: AC

## 2024-04-21 MED ORDER — PROMETHAZINE-DM 6.25-15 MG/5ML PO SYRP: 5.0000 mL | ORAL_SOLUTION | Freq: Four times a day (QID) | ORAL | 0 refills | Status: DC | PRN

## 2024-04-21 NOTE — Progress Notes (Signed)
 New Patient Office Visit  Subjective    Patient ID: Debra Stafford, female    DOB: 30-Jan-1996  Age: 28 y.o. MRN: [id number]  CC:  Chief Complaint  Patient presents with   Establish Care    Here to establish care.   URI    Two children were dx Sunday and yesterday with PNA. Pt has felt sick for Sunday. Coughing, chest feels tight. Has some phlegm. Fevers of 99-101.2. No nausea or diarrhea. Pt states she has had PNA on and off since 28 years old. Hurts to breathe.     Discussed the use of AI scribe software for clinical note transcription with the patient, who gave verbal consent to proceed.  History of Present Illness   Debra Stafford is a 28 year old female presenting to establish care with illness-induced asthma who presents with a cough and fever.  She has had a cough for three days, accompanied by shortness of breath starting last night. She also felt tired this morning. Despite owning an albuterol  inhaler for her illness-induced asthma, she has not used it recently.  She has experienced fevers over 101F, with the most recent temperature being 100.9F this morning, managed with Tylenol . She has been producing phlegm for the last couple of days, and when she blows her nose, brown mucus is expelled. The phlegm is described as greenish-brown, especially in the morning and at night.  She reports a headache described as a tight band across her forehead. No facial pain or pressure. No abdominal pain.  Her current medications include Alka-Seltzer Plus for daytime and nighttime symptoms, which helps with her runny nose, and Tylenol  to manage her fever.  She has a history of migraines that mimic stroke-like symptoms, for which she has not been treated since her last pregnancy. She has not been on triptans before and has not tried magnesium  glycinate for her migraines.      Outpatient Encounter Medications as of 04/21/2024  Medication Sig   acetaminophen  (TYLENOL ) 325 MG tablet Take  2 tablets (650 mg total) by mouth every 4 (four) hours as needed (for pain scale < 4).   amoxicillin -clavulanate (AUGMENTIN ) 875-125 MG tablet Take 1 tablet by mouth 2 (two) times daily for 7 days.   cyclobenzaprine  (FLEXERIL ) 5 MG tablet Take 1 tablet (5 mg total) by mouth 3 (three) times daily as needed for muscle spasms. MAY CAUSE DROWSINESS, DO NOT OPERATE HEAVY MACHINERY AFTER TAKING THIS MEDICATION   [EXPIRED] fluconazole  (DIFLUCAN ) 150 MG tablet Take 1 tablet (150 mg total) by mouth once for 1 dose. Take second tablet if not resolved in 72 hours.   ibuprofen  (ADVIL ) 600 MG tablet Take 1 tablet (600 mg total) by mouth every 6 (six) hours.   promethazine -dextromethorphan (PROMETHAZINE -DM) 6.25-15 MG/5ML syrup Take 5 mLs by mouth 4 (four) times daily as needed for cough.   [DISCONTINUED] Prenatal Vit-Fe Fumarate-FA (PRENATAL MULTIVITAMIN) TABS tablet Take 1 tablet by mouth daily at 12 noon.   No facility-administered encounter medications on file as of 04/21/2024.    Past Medical History:  Diagnosis Date   Anemia    Anxiety    Headache    Migraine    minic stroke like sxs   UTI (urinary tract infection)    Vasovagal episode     Past Surgical History:  Procedure Laterality Date   NO PAST SURGERIES      Family History  Problem Relation Age of Onset   Depression Mother    Diabetes Mother  Aneurysm Father    Asthma Father    Hypertension Father    High Cholesterol Father    Asthma Brother    Mental illness Half-Sister     Social History   Socioeconomic History   Marital status: Single    Spouse name: Not on file   Number of children: 2   Years of education: Not on file   Highest education level: Not on file  Occupational History   Not on file  Tobacco Use   Smoking status: Never    Passive exposure: Never   Smokeless tobacco: Never  Vaping Use   Vaping status: Never Used  Substance and Sexual Activity   Alcohol use: Not Currently   Drug use: No   Sexual  activity: Yes    Birth control/protection: Condom  Other Topics Concern   Not on file  Social History Narrative      Social Drivers of Health   Financial Resource Strain: Not on file  Food Insecurity: No Food Insecurity (04/21/2024)   Hunger Vital Sign    Worried About Running Out of Food in the Last Year: Never true    Ran Out of Food in the Last Year: Never true  Transportation Needs: No Transportation Needs (04/21/2024)   PRAPARE - Administrator, Civil Service (Medical): No    Lack of Transportation (Non-Medical): No  Physical Activity: Not on file  Stress: Not on file  Social Connections: Not on file  Intimate Partner Violence: Not At Risk (04/21/2024)   Humiliation, Afraid, Rape, and Kick questionnaire    Fear of Current or Ex-Partner: No    Emotionally Abused: No    Physically Abused: No    Sexually Abused: No    ROS  Per HPI      Objective    BP 98/65   Pulse 78   Temp 98.3 F (36.8 C) (Oral)   Resp 16   Ht 5' 5 (1.651 m)   Wt 159 lb 9.6 oz (72.4 kg)   LMP 04/20/2024 (Approximate)   SpO2 97%   Breastfeeding No   BMI 26.56 kg/m   Physical Exam Constitutional:      General: She is not in acute distress.    Appearance: Normal appearance. She is not ill-appearing.  HENT:     Head: Normocephalic and atraumatic.     Nose: Nose normal.     Comments: Sinus tenderness Eyes:     General: No scleral icterus.    Conjunctiva/sclera: Conjunctivae normal.  Cardiovascular:     Rate and Rhythm: Normal rate and regular rhythm.     Heart sounds: Normal heart sounds. No murmur heard.    No friction rub.  Pulmonary:     Effort: Pulmonary effort is normal. No respiratory distress.     Breath sounds: Normal breath sounds. No wheezing, rhonchi or rales.  Musculoskeletal:        General: Normal range of motion.  Skin:    General: Skin is warm and dry.     Coloration: Skin is not jaundiced or pale.  Neurological:     General: No focal deficit present.      Mental Status: She is alert.  Psychiatric:        Mood and Affect: Mood normal.        Behavior: Behavior normal.         Assessment & Plan:    Assessment and Plan    Sinus infection with cough and fever, rule out pneumonia  Cough for three days with shortness of breath starting last night. Fever over 101F, currently managed with Tylenol . Productive cough with greenish-brown phlegm. Differential includes viral versus bacterial infection, with higher suspicion for bacterial due to high fever and family history of pneumonia in children. Lungs sound clear on examination. - Ordered chest x-ray to rule out pneumonia - Will prescribe antibiotic pending chest x-ray results- augmentin  bid x 7 days - Prescribed promethazine  DM syrup for cough, up to four times per day - Advised to continue Tylenol  for fever management  Intermittent illness-induced asthma Asthma symptoms occur only when sick. No current use of albuterol  inhaler. - Advised to use albuterol  inhaler if wheezing occurs  Complicated migraine Migraines with stroke-like symptoms, previously evaluated by neurologist. No recent follow-up with neurologist. Magnesium  supplementation not tolerated due to gastrointestinal side effects. - Recommended magnesium  glycinate 400 mg nightly for migraine prevention and sleep aid - Advised follow-up with neurologist for further evaluation and management  Vulvovaginal candidiasis Recurrent yeast infections following antibiotic use. - Prescribed fluconazole , two tablets, with instructions to take the second tablet if symptoms persist after 72 hours  General health maintenance No primary care for six years. Last cervical cancer screening not discussed. - Will schedule follow-up for physical examination and cervical cancer screening early next year      Return in about 3 months (around 07/20/2024) for Annual Physical.   Ashvin Adelson T Eshaan Titzer, PA-C

## 2024-04-21 NOTE — Patient Instructions (Addendum)
 It was nice to see you today!  As we discussed in clinic:  I recommend Magnesium  glycinate 400mg  nightly to help with your migraines.  You may use promethazine  DM syrup to help with your cough. I will let you know how your chest xray comes back.  If you have any problems before your next visit feel free to message me via MyChart (minor issues or questions) or call the office, otherwise you may reach out to schedule an office visit.  Thank you! Aradia Estey, PA-C

## 2024-04-23 DIAGNOSIS — J4520 Mild intermittent asthma, uncomplicated: Secondary | ICD-10-CM | POA: Insufficient documentation

## 2024-04-23 DIAGNOSIS — G43109 Migraine with aura, not intractable, without status migrainosus: Secondary | ICD-10-CM | POA: Insufficient documentation

## 2024-04-26 ENCOUNTER — Encounter (HOSPITAL_BASED_OUTPATIENT_CLINIC_OR_DEPARTMENT_OTHER): Payer: Self-pay

## 2024-05-02 ENCOUNTER — Telehealth

## 2024-05-02 ENCOUNTER — Telehealth: Admitting: Family

## 2024-05-02 DIAGNOSIS — R112 Nausea with vomiting, unspecified: Secondary | ICD-10-CM | POA: Diagnosis not present

## 2024-05-02 MED ORDER — ONDANSETRON 4 MG PO TBDP: 4.0000 mg | ORAL_TABLET | Freq: Three times a day (TID) | ORAL | 0 refills | Status: DC | PRN

## 2024-05-02 NOTE — Progress Notes (Signed)
 We are sorry that you are not feeling well. Here is how we plan to help!  Based on what you have shared with me it looks like you have a Virus that is irritating your GI tract.  Vomiting is the forceful emptying of a portion of the stomach's content through the mouth.  Although nausea and vomiting can make you feel miserable, it's important to remember that these are not diseases, but rather symptoms of an underlying illness.  When we treat short term symptoms, we always caution that any symptoms that persist should be fully evaluated in a medical office.  I have prescribed a medication that will help alleviate your symptoms and allow you to stay hydrated:  Zofran  4 mg 1 tablet every 8 hours as needed for nausea and vomiting  HOME CARE: Drink clear liquids.  This is very important! Dehydration (the lack of fluid) can lead to a serious complication.  Start off with 1 tablespoon every 5 minutes for 8 hours. You may begin eating bland foods after 8 hours without vomiting.  Start with saltine crackers, white bread, rice, mashed potatoes, applesauce. After 48 hours on a bland diet, you may resume a normal diet. Try to go to sleep.  Sleep often empties the stomach and relieves the need to vomit.  GET HELP RIGHT AWAY IF:  Your symptoms do not improve or worsen within 2 days after treatment. You have a fever for over 3 days. You cannot keep down fluids after trying the medication.  MAKE SURE YOU:  Understand these instructions. Will watch your condition. Will get help right away if you are not doing well or get worse.   Thank you for choosing an e-visit. Your e-visit answers were reviewed by a board certified advanced clinical practitioner to complete your personal care plan. Depending upon the condition, your plan could have included both over the counter or prescription medications. Please review your pharmacy choice. Be sure that the pharmacy you have chosen is open so that you can pick up  your prescription now.  If there is a problem you may message your provider in MyChart to have the prescription routed to another pharmacy. Your safety is important to us . If you have drug allergies check your prescription carefully.  For the next 24 hours, you can use MyChart to ask questions about today's visit, request a non-urgent call back, or ask for a work or school excuse from your e-visit provider. You will get an e-mail in the next two days asking about your experience. I hope that your e-visit has been valuable and will speed your recovery.  I have spent 5 minutes in review of e-visit questionnaire, review and updating patient chart, medical decision making and response to patient.   Bari Learn, FNP

## 2024-05-18 ENCOUNTER — Encounter (HOSPITAL_BASED_OUTPATIENT_CLINIC_OR_DEPARTMENT_OTHER): Payer: Self-pay | Admitting: Student

## 2024-05-18 ENCOUNTER — Ambulatory Visit (HOSPITAL_BASED_OUTPATIENT_CLINIC_OR_DEPARTMENT_OTHER): Admitting: Student

## 2024-05-18 VITALS — BP 103/72 | HR 90 | Temp 98.1°F | Resp 16 | Ht 65.0 in | Wt 162.7 lb

## 2024-05-18 DIAGNOSIS — J302 Other seasonal allergic rhinitis: Secondary | ICD-10-CM | POA: Diagnosis not present

## 2024-05-18 DIAGNOSIS — S39012A Strain of muscle, fascia and tendon of lower back, initial encounter: Secondary | ICD-10-CM

## 2024-05-18 DIAGNOSIS — N920 Excessive and frequent menstruation with regular cycle: Secondary | ICD-10-CM | POA: Diagnosis not present

## 2024-05-18 DIAGNOSIS — R5382 Chronic fatigue, unspecified: Secondary | ICD-10-CM | POA: Insufficient documentation

## 2024-05-18 MED ORDER — MELOXICAM 15 MG PO TABS: 15.0000 mg | ORAL_TABLET | Freq: Every day | ORAL | 0 refills | Status: AC

## 2024-05-18 NOTE — Progress Notes (Unsigned)
 "  Acute Office Visit  Subjective:     Patient ID: Debra Stafford, female    DOB: April 11, 1996, 28 y.o.   MRN: [id number]  Chief Complaint  Patient presents with   Fatigue    Been really tired. Feels like her hgb has dropped. Iron  makes her throw up.   Back Pain    Feels like she pulled her back a couple weeks ago. Flexeril  did not help with spasms last time.     HPI  Discussed the use of AI scribe software for clinical note transcription with the patient, who gave verbal consent to proceed.  History of Present Illness   Debra Stafford is a 28 year old female who presents with fatigue and back pain.  She has been experiencing significant fatigue over the past two to three months. Despite adequate sleep, she feels exhausted after small activities like washing dishes. She has a history of anemia and suspects her hemoglobin levels may have dropped, as she experiences lightheadedness and dizziness. No recent illness or fever, but she attributes nasal symptoms to allergies, which are exacerbated by fluctuating weather conditions. She uses Claritin for her allergies, which provides relief.  Her menstrual periods are heavy, with the first four days being particularly intense. She uses approximately twelve tampons per day during the initial days of her period, which lasts about seven days in total. Her periods became heavier after her second childbirth, during which she experienced prolonged bleeding compared to her first.  She has been experiencing back pain for about a month, described as a shooting sharp pain on the left side of her back. The pain first occurred while changing her son's diaper, causing her to fall and be unable to get up. It recurred a few weeks later while moving a refrigerator. The pain is less severe the following day but remains sore and is aggravated by certain movements. She reports tingling in the area when moving in specific ways, but no pain radiating down her legs  currently. She has a history of lifting patients in healthcare.      ROS Per HPI     Objective:    BP 103/72   Pulse 90   Temp 98.1 F (36.7 C) (Oral)   Resp 16   Ht 5' 5 (1.651 m)   Wt 162 lb 11.2 oz (73.8 kg)   LMP 04/20/2024 (Approximate)   SpO2 97%   Breastfeeding No   BMI 27.07 kg/m    Physical Exam Constitutional:      General: She is not in acute distress.    Appearance: Normal appearance. She is not ill-appearing.  HENT:     Head: Normocephalic and atraumatic.     Nose: Nose normal.  Eyes:     General: No scleral icterus.    Conjunctiva/sclera: Conjunctivae normal.  Cardiovascular:     Rate and Rhythm: Normal rate and regular rhythm.     Heart sounds: Normal heart sounds. No murmur heard.    No friction rub.  Pulmonary:     Effort: Pulmonary effort is normal. No respiratory distress.     Breath sounds: Normal breath sounds. No wheezing, rhonchi or rales.  Musculoskeletal:        General: Normal range of motion.     Comments: Back Exam:  Inspection: Unremarkable  Motion: Flexion 45 deg, Extension 45 deg, Side Bending to 45 deg bilaterally,  Rotation to 45 deg bilaterally  SLR laying: neg Palpable tenderness: left sided paraspinous muscles. Sensory change:  Gross sensation intact Gait normal    Skin:    General: Skin is warm and dry.     Coloration: Skin is not jaundiced or pale.  Neurological:     General: No focal deficit present.     Mental Status: She is alert.  Psychiatric:        Mood and Affect: Mood normal.        Behavior: Behavior normal.     Results for orders placed or performed in visit on 05/18/24  CBC with Differential/Platelet  Result Value Ref Range   WBC 9.6 3.4 - 10.8 x10E3/uL   RBC 4.47 3.77 - 5.28 x10E6/uL   Hemoglobin 13.1 11.1 - 15.9 g/dL   Hematocrit 59.3 65.9 - 46.6 %   MCV 91 79 - 97 fL   MCH 29.3 26.6 - 33.0 pg   MCHC 32.3 31.5 - 35.7 g/dL   RDW 87.4 88.2 - 84.5 %   Platelets 256 150 - 450 x10E3/uL    Neutrophils 63 Not Estab. %   Lymphs 20 Not Estab. %   Monocytes 12 Not Estab. %   Eos 4 Not Estab. %   Basos 1 Not Estab. %   Neutrophils Absolute 6.1 1.4 - 7.0 x10E3/uL   Lymphocytes Absolute 1.9 0.7 - 3.1 x10E3/uL   Monocytes Absolute 1.1 (H) 0.1 - 0.9 x10E3/uL   EOS (ABSOLUTE) 0.4 0.0 - 0.4 x10E3/uL   Basophils Absolute 0.1 0.0 - 0.2 x10E3/uL   Immature Granulocytes 0 Not Estab. %   Immature Grans (Abs) 0.0 0.0 - 0.1 x10E3/uL  Comprehensive metabolic panel with GFR  Result Value Ref Range   Glucose 73 70 - 99 mg/dL   BUN 12 6 - 20 mg/dL   Creatinine, Ser 9.41 0.57 - 1.00 mg/dL   eGFR 871 >40 fO/fpw/8.26   BUN/Creatinine Ratio 21 9 - 23   Sodium 139 134 - 144 mmol/L   Potassium 4.2 3.5 - 5.2 mmol/L   Chloride 104 96 - 106 mmol/L   CO2 20 20 - 29 mmol/L   Calcium  9.1 8.7 - 10.2 mg/dL   Total Protein 6.5 6.0 - 8.5 g/dL   Albumin 4.3 4.0 - 5.0 g/dL   Globulin, Total 2.2 1.5 - 4.5 g/dL   Bilirubin Total 0.5 0.0 - 1.2 mg/dL   Alkaline Phosphatase 77 41 - 116 IU/L   AST 16 0 - 40 IU/L   ALT 13 0 - 32 IU/L  Vitamin B12  Result Value Ref Range   Vitamin B-12 191 (L) 232 - 1,245 pg/mL  VITAMIN D 25 Hydroxy (Vit-D Deficiency, Fractures)  Result Value Ref Range   Vit D, 25-Hydroxy 10.9 (L) 30.0 - 100.0 ng/mL  Iron , TIBC and Ferritin Panel  Result Value Ref Range   Total Iron  Binding Capacity 300 250 - 450 ug/dL   UIBC 810 868 - 574 ug/dL   Iron  111 27 - 159 ug/dL   Iron  Saturation 37 15 - 55 %   Ferritin 41 15 - 150 ng/mL        Assessment & Plan:   Assessment and Plan    Chronic fatigue Persisting for 2-3 months, possibly related to heavy menstrual bleeding and iron  deficiency anemia. Differential includes vitamin B12 and vitamin D deficiencies. - Ordered CBC, B12, and vitamin D levels - Checked iron  levels  Heavy menstrual bleeding Heavy menstrual bleeding with concern for iron  deficiency anemia. Reports using 12 tampons per day for the first 4 days of  menstruation, lasting about 7 days.  Symptoms include lightheadedness and dizziness. Iron  supplements previously caused nausea. - Checked iron  levels - Recommended slow FE for iron  supplementation, every other day - If slow FE is not tolerated, consider liquid iron  formulation  Lumbar paraspinous muscle strain Left-sided lumbar paraspinous muscle strain, likely due to muscle strain from lifting and physical activity. Symptoms include sharp pain and tingling, but no radiculopathy. Negative straight leg raise test. Imaging not indicated as symptoms are consistent with muscle strain. - Prescribed meloxicam for pain and inflammation - Recommended physical therapy for back strengthening - Provided exercises from the [employer] - If symptoms persist, will consider extended dose steroid pack  Seasonal allergic rhinitis Symptoms exacerbated by weather changes. Currently using Claritin with some relief. Discussed alternative options if Claritin is insufficient. - Continue Claritin if effective - If Claritin is insufficient, try Xyzal at night      Return if symptoms worsen or fail to improve.  Terressa Evola T Evaline Waltman, PA-C  "

## 2024-05-18 NOTE — Patient Instructions (Addendum)
 It was nice to see you today!  As we discussed in clinic:  You have a paraspinous muscle strain. You may use meloxicam 15 mg once daily with food for about 2 weeks at a time (past this can give you GI issues). Do not take with ibuprofen  or Aleve.  You may try the [employer] for your back. If you have tried for a few weeks and have no relief, We would be happy to get you in with Physical Therapy.  Try 'Slow FE' for your iron  levels. If that does not work, look for a liquid formulation.  If you have any problems before your next visit feel free to message me via MyChart (minor issues or questions) or call the office, otherwise you may reach out to schedule an office visit.  Thank you! Danyla Wattley, PA-C

## 2024-05-19 ENCOUNTER — Ambulatory Visit (HOSPITAL_BASED_OUTPATIENT_CLINIC_OR_DEPARTMENT_OTHER): Payer: Self-pay | Admitting: Student

## 2024-05-19 DIAGNOSIS — E538 Deficiency of other specified B group vitamins: Secondary | ICD-10-CM | POA: Insufficient documentation

## 2024-05-19 DIAGNOSIS — E559 Vitamin D deficiency, unspecified: Secondary | ICD-10-CM | POA: Insufficient documentation

## 2024-05-19 LAB — CBC WITH DIFFERENTIAL/PLATELET
Basophils Absolute: 0.1 x10E3/uL (ref 0.0–0.2)
Basos: 1 %
EOS (ABSOLUTE): 0.4 x10E3/uL (ref 0.0–0.4)
Eos: 4 %
Hematocrit: 40.6 % (ref 34.0–46.6)
Hemoglobin: 13.1 g/dL (ref 11.1–15.9)
Immature Grans (Abs): 0 x10E3/uL (ref 0.0–0.1)
Immature Granulocytes: 0 %
Lymphocytes Absolute: 1.9 x10E3/uL (ref 0.7–3.1)
Lymphs: 20 %
MCH: 29.3 pg (ref 26.6–33.0)
MCHC: 32.3 g/dL (ref 31.5–35.7)
MCV: 91 fL (ref 79–97)
Monocytes Absolute: 1.1 x10E3/uL — ABNORMAL HIGH (ref 0.1–0.9)
Monocytes: 12 %
Neutrophils Absolute: 6.1 x10E3/uL (ref 1.4–7.0)
Neutrophils: 63 %
Platelets: 256 x10E3/uL (ref 150–450)
RBC: 4.47 x10E6/uL (ref 3.77–5.28)
RDW: 12.5 % (ref 11.7–15.4)
WBC: 9.6 x10E3/uL (ref 3.4–10.8)

## 2024-05-19 LAB — COMPREHENSIVE METABOLIC PANEL WITH GFR
ALT: 13 IU/L (ref 0–32)
AST: 16 IU/L (ref 0–40)
Albumin: 4.3 g/dL (ref 4.0–5.0)
Alkaline Phosphatase: 77 IU/L (ref 41–116)
BUN/Creatinine Ratio: 21 (ref 9–23)
BUN: 12 mg/dL (ref 6–20)
Bilirubin Total: 0.5 mg/dL (ref 0.0–1.2)
CO2: 20 mmol/L (ref 20–29)
Calcium: 9.1 mg/dL (ref 8.7–10.2)
Chloride: 104 mmol/L (ref 96–106)
Creatinine, Ser: 0.58 mg/dL (ref 0.57–1.00)
Globulin, Total: 2.2 g/dL (ref 1.5–4.5)
Glucose: 73 mg/dL (ref 70–99)
Potassium: 4.2 mmol/L (ref 3.5–5.2)
Sodium: 139 mmol/L (ref 134–144)
Total Protein: 6.5 g/dL (ref 6.0–8.5)
eGFR: 128 mL/min/1.73

## 2024-05-19 LAB — IRON,TIBC AND FERRITIN PANEL
Ferritin: 41 ng/mL (ref 15–150)
Iron Saturation: 37 % (ref 15–55)
Iron: 111 ug/dL (ref 27–159)
Total Iron Binding Capacity: 300 ug/dL (ref 250–450)
UIBC: 189 ug/dL (ref 131–425)

## 2024-05-19 LAB — VITAMIN D 25 HYDROXY (VIT D DEFICIENCY, FRACTURES): Vit D, 25-Hydroxy: 10.9 ng/mL — ABNORMAL LOW (ref 30.0–100.0)

## 2024-05-19 LAB — VITAMIN B12: Vitamin B-12: 191 pg/mL — ABNORMAL LOW (ref 232–1245)

## 2024-05-19 MED ORDER — VITAMIN D (ERGOCALCIFEROL) 1.25 MG (50000 UNIT) PO CAPS: 50000.0000 [IU] | ORAL_CAPSULE | ORAL | 0 refills | Status: AC

## 2024-06-25 ENCOUNTER — Encounter (HOSPITAL_BASED_OUTPATIENT_CLINIC_OR_DEPARTMENT_OTHER): Payer: Self-pay | Admitting: Student

## 2024-07-22 ENCOUNTER — Encounter (HOSPITAL_BASED_OUTPATIENT_CLINIC_OR_DEPARTMENT_OTHER): Admitting: Student
